# Patient Record
Sex: Male | Born: 1995 | Race: Black or African American | Hispanic: No | Marital: Single | Smoking: Current some day smoker
Health system: Southern US, Community
[De-identification: ages and names within clinical notes are randomized; demographics above are authoritative.]

## PROBLEM LIST (undated history)

## (undated) ENCOUNTER — Ambulatory Visit (HOSPITAL_COMMUNITY): Admission: EM | Payer: Medicaid Other | Source: Home / Self Care

## (undated) DIAGNOSIS — F909 Attention-deficit hyperactivity disorder, unspecified type: Secondary | ICD-10-CM

## (undated) DIAGNOSIS — K760 Fatty (change of) liver, not elsewhere classified: Secondary | ICD-10-CM

## (undated) DIAGNOSIS — S83289A Other tear of lateral meniscus, current injury, unspecified knee, initial encounter: Secondary | ICD-10-CM

## (undated) DIAGNOSIS — J45909 Unspecified asthma, uncomplicated: Secondary | ICD-10-CM

## (undated) DIAGNOSIS — R7303 Prediabetes: Secondary | ICD-10-CM

## (undated) DIAGNOSIS — F419 Anxiety disorder, unspecified: Secondary | ICD-10-CM

## (undated) DIAGNOSIS — J302 Other seasonal allergic rhinitis: Secondary | ICD-10-CM

## (undated) DIAGNOSIS — I10 Essential (primary) hypertension: Secondary | ICD-10-CM

## (undated) HISTORY — PX: HAND SURGERY: SHX662

## (undated) HISTORY — DX: Attention-deficit hyperactivity disorder, unspecified type: F90.9

## (undated) HISTORY — DX: Unspecified asthma, uncomplicated: J45.909

## (undated) HISTORY — PX: CHOLECYSTECTOMY, LAPAROSCOPIC: SHX56

---
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
## 2017-01-29 ENCOUNTER — Ambulatory Visit (HOSPITAL_COMMUNITY)
Admission: EM | Admit: 2017-01-29 | Discharge: 2017-01-29 | Disposition: A | Discharge status: Home / Self Care | Payer: Medicaid Other | Attending: Internal Medicine | Admitting: Internal Medicine

## 2017-01-29 ENCOUNTER — Encounter (HOSPITAL_COMMUNITY): Payer: Self-pay | Admitting: *Deleted

## 2017-01-29 DIAGNOSIS — J069 Acute upper respiratory infection, unspecified: Secondary | ICD-10-CM | POA: Diagnosis not present

## 2017-01-29 MED ORDER — ONDANSETRON 4 MG PO TBDP: 4.0000 mg | ORAL_TABLET | Freq: Three times a day (TID) | ORAL | 0 refills | Status: DC | PRN

## 2017-01-29 MED ORDER — IPRATROPIUM BROMIDE 0.06 % NA SOLN: 2.0000 | Freq: Four times a day (QID) | NASAL | 0 refills | Status: DC

## 2017-01-29 MED ORDER — BENZONATATE 100 MG PO CAPS: 100.0000 mg | ORAL_CAPSULE | Freq: Three times a day (TID) | ORAL | 0 refills | Status: DC

## 2017-01-29 NOTE — ED Provider Notes (Signed)
CSN: [id number]     Arrival date & time 01/29/17  1344 History   First MD Initiated Contact with Patient 01/29/17 1545     Chief Complaint  Patient presents with  . Emesis   (Consider location/radiation/quality/duration/timing/severity/associated sxs/prior Treatment) The history is provided by the patient.  URI  Presenting symptoms: congestion, cough, ear pain and rhinorrhea   Presenting symptoms: no facial pain, no fatigue, no fever and no sore throat   Severity:  Moderate Onset quality:  Gradual Duration:  3 days Timing:  Constant Progression:  Worsening Chronicity:  New Relieved by:  None tried Worsened by:  Nothing Ineffective treatments:  None tried Associated symptoms: headaches   Associated symptoms: no arthralgias, no myalgias, no neck pain, no sinus pain, no sneezing, no swollen glands and no wheezing     Past Medical History:  Diagnosis Date  . ADHD (attention deficit hyperactivity disorder)   . Asthma    No past surgical history on file. Family History  Problem Relation Age of Onset  . Obesity Mother   . Sleep apnea Mother   . COPD Other   . Hyperlipidemia Other   . Hypertension Other    Social History  Substance Use Topics  . Smoking status: Never Smoker  . Smokeless tobacco: Never Used  . Alcohol use No    Review of Systems  Constitutional: Negative for fatigue and fever.  HENT: Positive for congestion, ear pain and rhinorrhea. Negative for sinus pain, sneezing and sore throat.   Respiratory: Positive for cough. Negative for wheezing.   Gastrointestinal: Positive for nausea and vomiting. Negative for abdominal pain and diarrhea.  Musculoskeletal: Negative for arthralgias, myalgias and neck pain.  Skin: Negative.   Neurological: Positive for headaches. Negative for light-headedness.    Allergies  Patient has no known allergies.  Home Medications   Prior to Admission medications   Medication Sig Start Date End Date Taking? Authorizing Provider   amphetamine-dextroamphetamine (ADDERALL XR) 30 MG 24 hr capsule Take 1 capsule (30 mg total) by mouth daily. 04/07/16   Verneda SkillHacker, Caroline T, FNP  amphetamine-dextroamphetamine (ADDERALL XR) 30 MG 24 hr capsule Take 1 capsule (30 mg total) by mouth daily. 12/06/16   Verneda SkillHacker, Caroline T, FNP  amphetamine-dextroamphetamine (ADDERALL XR) 30 MG 24 hr capsule Take 1 capsule (30 mg total) by mouth daily. 12/06/16   Verneda SkillHacker, Caroline T, FNP  amphetamine-dextroamphetamine (ADDERALL) 10 MG tablet Take 1 tablet in the afternoons as needed for work days 12/06/16   Alfonso RamusHacker, Caroline T, FNP  benzonatate (TESSALON) 100 MG capsule Take 1 capsule (100 mg total) by mouth every 8 (eight) hours. 01/29/17   Dorena BodoKennard, Henrique Parekh, NP  diclofenac (VOLTAREN) 75 MG EC tablet Take 1 tablet (75 mg total) by mouth 2 (two) times daily. 06/07/16   Elvina SidleLauenstein, Kurt, MD  FLUoxetine (PROZAC) 20 MG capsule Take 1 capsule (20 mg total) by mouth daily. 12/06/16   Verneda SkillHacker, Caroline T, FNP  ipratropium (ATROVENT) 0.06 % nasal spray Place 2 sprays into both nostrils 4 (four) times daily. 01/29/17   Dorena BodoKennard, Stuti Sandin, NP  mupirocin ointment (BACTROBAN) 2 % Apply 1 application topically 3 (three) times daily. For mild skin infection 11/22/16   Voncille LoEttefagh, Kate, MD  ondansetron (ZOFRAN ODT) 4 MG disintegrating tablet Take 1 tablet (4 mg total) by mouth every 8 (eight) hours as needed for nausea or vomiting. 01/29/17   Dorena BodoKennard, Brenin Heidelberger, NP  Vitamin D, Ergocalciferol, (DRISDOL) 50000 units CAPS capsule Take 1 capsule (50,000 Units total) by mouth every 7 (seven)  days. 12/06/16   Verneda Skill, FNP   Meds Ordered and Administered this Visit  Medications - No data to display  Pulse 81   Temp 98.8 F (37.1 C) (Oral)   Resp 18   SpO2 99%  No data found.   Physical Exam  Constitutional: He is oriented to person, place, and time. He appears well-developed and well-nourished. No distress.  HENT:  Head: Normocephalic.  Right Ear: External ear normal.   Left Ear: External ear normal.  Mouth/Throat: Oropharynx is clear and moist.  Eyes: Conjunctivae are normal.  Neck: Normal range of motion.  Cardiovascular: Normal rate and regular rhythm.   Pulmonary/Chest: Effort normal and breath sounds normal.  Abdominal: Soft. Bowel sounds are normal.  Neurological: He is alert and oriented to person, place, and time.  Skin: Skin is warm and dry. Capillary refill takes less than 2 seconds. He is not diaphoretic.  Psychiatric: He has a normal mood and affect. His behavior is normal.  Nursing note and vitals reviewed.   Urgent Care Course     Procedures (including critical care time)  Labs Review Labs Reviewed - No data to display  Imaging Review No results found.      MDM   1. Viral upper respiratory tract infection    Viral upper respiratory infection. Recommend rest, fluids, given Tessalon for cough, ipratropium for rhinorrhea, Zofran for nausea or vomiting. Return to clinic as needed.    Dorena Bodo, NP 01/29/17 1731

## 2017-01-29 NOTE — ED Triage Notes (Signed)
Pt  Reports   Vomiting       Burning      Sensation      X   3   Days   No  Diarrhea   No  abd  pain

## 2017-01-29 NOTE — Discharge Instructions (Signed)
You most likely have a viral URI, I advise rest, plenty of fluids and management of symptoms with over the counter medicines. For symptoms you may take Tylenol as needed every 4-6 hours for body aches or fever, not to exceed 4,000 mg a day, Take mucinex or mucinex DM ever 12 hours with a full glass of water, you may use an inhaled steroid such as Flonase, 2 sprays each nostril once a day for congestion, or an antihistamine such as Claritin or Zyrtec once a day. For cough, I have prescribed a medication called Tessalon. Take 1 tablet every 8 hours as needed for your cough. For Nausea, I have prescribed Zofran, take 1 tablet under the tongue every 8 hours as needed. For runny nose prescribed ipratropium, due to sprays each nostril up to 4 times a day. Should your symptoms worsen or fail to resolve, follow up with your primary care provider or return to clinic.

## 2017-02-15 DIAGNOSIS — Z0271 Encounter for disability determination: Secondary | ICD-10-CM

## 2017-02-19 DIAGNOSIS — S83289A Other tear of lateral meniscus, current injury, unspecified knee, initial encounter: Secondary | ICD-10-CM

## 2017-02-19 HISTORY — DX: Other tear of lateral meniscus, current injury, unspecified knee, initial encounter: S83.289A

## 2017-03-06 ENCOUNTER — Encounter: Payer: Self-pay | Admitting: Pediatrics

## 2017-03-06 ENCOUNTER — Ambulatory Visit (INDEPENDENT_AMBULATORY_CARE_PROVIDER_SITE_OTHER): Payer: Medicaid Other | Admitting: Pediatrics

## 2017-03-06 VITALS — BP 128/81 | HR 67 | Ht 69.0 in | Wt 302.0 lb

## 2017-03-06 DIAGNOSIS — F902 Attention-deficit hyperactivity disorder, combined type: Secondary | ICD-10-CM

## 2017-03-06 DIAGNOSIS — F4322 Adjustment disorder with anxiety: Secondary | ICD-10-CM

## 2017-03-06 DIAGNOSIS — Z6841 Body Mass Index (BMI) 40.0 and over, adult: Secondary | ICD-10-CM | POA: Diagnosis not present

## 2017-03-06 DIAGNOSIS — F909 Attention-deficit hyperactivity disorder, unspecified type: Secondary | ICD-10-CM

## 2017-03-06 MED ORDER — AMPHETAMINE-DEXTROAMPHET ER 30 MG PO CP24: 30.0000 mg | ORAL_CAPSULE | Freq: Every day | ORAL | 0 refills | Status: DC

## 2017-03-06 MED ORDER — FLUOXETINE HCL 20 MG PO CAPS: 20.0000 mg | ORAL_CAPSULE | Freq: Every day | ORAL | 3 refills | Status: DC

## 2017-03-06 NOTE — Progress Notes (Signed)
THIS RECORD MAY CONTAIN CONFIDENTIAL INFORMATION THAT SHOULD NOT BE RELEASED WITHOUT REVIEW OF THE SERVICE PROVIDER.  Adolescent Medicine Consultation Follow-Up Visit [name]  is a 21 y.o. male referred by [name], MD here today for follow-up regarding ADHD and anxiety.    Last seen in [facility] on 12/06/16 for above.   Plan at last visit included continuing current dose of Adderall and fluoxetine.   Pertinent Labs? No since visit on 11/22/16. Growth Chart Viewed? yes   History was provided by the patient.  Interpreter? no  PCP Confirmed?  yes  My Chart Activated?   no    Chief Complaint  Patient presents with  . Follow-up  . Medication Management    HPI:   He was working at [employer], stopped due to knee injury.  Planning in to work at [employer]. Patient has been doing well. He has not had any panic attacks in the last 4 weeks.  Patient is without concerns today.  He is without symptoms of headache, SOB, chest pain or abdominal pain.  He feels medication is working well and does not wish to have any changes today.   No LMP for male patient. No Known Allergies Outpatient Medications Prior to Visit  Medication Sig Dispense Refill  . FLUoxetine (PROZAC) 20 MG capsule Take 1 capsule (20 mg total) by mouth daily. 30 capsule 3  . mupirocin ointment (BACTROBAN) 2 % Apply 1 application topically 3 (three) times daily. For mild skin infection 22 g 1  . Vitamin D, Ergocalciferol, (DRISDOL) 50000 units CAPS capsule Take 1 capsule (50,000 Units total) by mouth every 7 (seven) days. 12 capsule 0  . amphetamine-dextroamphetamine (ADDERALL XR) 30 MG 24 hr capsule Take 1 capsule (30 mg total) by mouth daily. 30 capsule 0  . amphetamine-dextroamphetamine (ADDERALL XR) 30 MG 24 hr capsule Take 1 capsule (30 mg total) by mouth daily. 30 capsule 0  . amphetamine-dextroamphetamine (ADDERALL XR) 30 MG 24 hr capsule Take 1 capsule (30 mg total) by mouth daily. 30 capsule  0  . amphetamine-dextroamphetamine (ADDERALL) 10 MG tablet Take 1 tablet in the afternoons as needed for work days 30 tablet 0  . benzonatate (TESSALON) 100 MG capsule Take 1 capsule (100 mg total) by mouth every 8 (eight) hours. 21 capsule 0  . diclofenac (VOLTAREN) 75 MG EC tablet Take 1 tablet (75 mg total) by mouth 2 (two) times daily. 30 tablet 0  . ipratropium (ATROVENT) 0.06 % nasal spray Place 2 sprays into both nostrils 4 (four) times daily. 15 mL 0  . ondansetron (ZOFRAN ODT) 4 MG disintegrating tablet Take 1 tablet (4 mg total) by mouth every 8 (eight) hours as needed for nausea or vomiting. 20 tablet 0   No facility-administered medications prior to visit.      Patient Active Problem List   Diagnosis Date Noted  . Chronic pain of right knee 11/22/2016  . Elevated BP without diagnosis of hypertension 11/22/2016  . Acanthosis nigricans 07/09/2014  . Allergic rhinitis 12/29/2013  . Adjustment disorder with anxiety 12/10/2013  . Vitamin D deficiency 07/12/2013  . ADHD (attention deficit hyperactivity disorder) 05/15/2013  . Obesity 05/15/2013    Social History:  Currently in long-term relationship with girlfriend of 3 years.    The following portions of the patient's history were reviewed and updated as appropriate: allergies, current medications, past family history, past medical history, past social history and problem list.  Physical Exam:  Vitals:   03/06/17 0915  BP: 128/81  Pulse: 67  Weight: (!) 302 lb (137 kg)  Height: 5\' 9"  (1.753 m)   BP 128/81   Pulse 67   Ht 5\' 9"  (1.753 m)   Wt (!) 302 lb (137 kg)   BMI 44.60 kg/m  Body mass index: body mass index is 44.6 kg/m. Blood pressure percentiles are 76 % systolic and 89 % diastolic based on the August 2017 AAP Clinical Practice Guideline. Blood pressure percentile targets: 90: 134/82, 95: 138/86, 95 + 12 mmHg: 150/98. This reading is in the Stage 1 hypertension range (BP >= 130/80).   Physical Exam General:  Well-appearing, well-nourished.  Pleasant adolescent male.  HEENT: Normocephalic, atraumatic, MMM. Oropharynx no erythema no exudates. Neck supple, no lymphadenopathy.  CV: Regular rate and rhythm, normal S1 and S2, no murmurs rubs or gallops.  PULM: Comfortable work of breathing. No accessory muscle use. Lungs CTA bilaterally without wheezes, rales, rhonchi.  ABD: Soft, non tender, non distended, normal bowel sounds.  EXT: Warm and well-perfused. Neuro: Grossly intact. No neurologic focalization.  Skin: Acanthosis nigricans around the neck    Assessment/Plan: 1. Attention deficit hyperactivity disorder (ADHD), combined type -Continue to take Adderall as prescribed  -GAD-7 score is zero without report of panic attacks.  -Patient is doing well.  - amphetamine-dextroamphetamine (ADDERALL XR) 30 MG 24 hr capsule; Take 1 capsule (30 mg total) by mouth daily.  Dispense: 30 capsule; Refill: 0 - amphetamine-dextroamphetamine (ADDERALL XR) 30 MG 24 hr capsule; Take 1 capsule (30 mg total) by mouth daily.  Dispense: 30 capsule; Refill: 0  2. Adjustment disorder with anxiety -Patient to continue taking fluoxetine.   - FLUoxetine (PROZAC) 20 MG capsule; Take 1 capsule (20 mg total) by mouth daily.  Dispense: 30 capsule; Refill: 3  3. Class 3 severe obesity due to excess calories without serious comorbidity with body mass index (BMI) of 40.0 to 44.9 in adult [facility](HCC) -Patient with continued weight gain s/p knee injury -He has started to exercise on the weekend with his friends.  -Encouraged to work on healthier lifestyle changes such as eating less fast food.  - Provided 5-2-1-0 rule counseling (Five fruits and vegetables a day, Two hours or less of non-educational screen time, 1 hour of physical activity per day, 0 sugary drinks)    BH screenings: PHQ-SADS reviewed and indicated no active depression or anxiety. Screens discussed with patient and parent and adjustments to plan made accordingly.    Follow-up:  Return for 3 month follow-up for medication management.   Medical decision-making:  >25 minutes spent face to face with patient with more than 50% of appointment spent discussing diagnosis, management, follow-up, and reviewing of symptoms related to visit.

## 2017-03-13 ENCOUNTER — Telehealth: Payer: Self-pay

## 2017-03-13 NOTE — Telephone Encounter (Signed)
Mom called stating patient did not receive his Adderall 10 mg tablets and only received 30 mg tabs. Mom requested refill before pt goes out of town tomorrow.

## 2017-03-14 ENCOUNTER — Other Ambulatory Visit: Payer: Self-pay | Admitting: Family

## 2017-03-14 MED ORDER — AMPHETAMINE-DEXTROAMPHET ER 10 MG PO CP24: 10.0000 mg | ORAL_CAPSULE | Freq: Every day | ORAL | 0 refills | Status: DC

## 2017-03-14 NOTE — Telephone Encounter (Signed)
I was out of the office yesterday.  I tried to call the patient today but there was no answer and the VM was not set up.

## 2017-03-14 NOTE — Telephone Encounter (Signed)
Rx written and ready for pick up

## 2017-03-16 ENCOUNTER — Encounter (HOSPITAL_BASED_OUTPATIENT_CLINIC_OR_DEPARTMENT_OTHER): Payer: Self-pay | Admitting: Physician Assistant

## 2017-03-16 NOTE — H&P (Signed)
[name] is an 21 y.o. male.   Chief Complaint: right knee lateral meniscus tear HPI: 21 year old male with a right knee injury last fall.  He has tried rest, NSAIDS, home exercise program.  MRI shows lateral meniscus tear  Past Medical History:  Diagnosis Date  . Acute lateral meniscus tear of right knee 05/22/2016  . ADHD (attention deficit hyperactivity disorder)   . Asthma     No past surgical history on file.  Family History  Problem Relation Age of Onset  . Obesity Mother   . Sleep apnea Mother   . COPD Other   . Hyperlipidemia Other   . Hypertension Other    Social History:  reports that he has never smoked. He has never used smokeless tobacco. He reports that he does not drink alcohol or use drugs.  Allergies: No Known Allergies  No prescriptions prior to admission.    No results found for this or any previous visit (from the past 48 hour(s)). No results found.  Review of Systems  Constitutional: Negative.   HENT: Negative.   Eyes: Negative.   Respiratory: Negative.   Cardiovascular: Negative.   Gastrointestinal: Negative.   Genitourinary: Negative.   Musculoskeletal: Positive for joint pain.       Right knee pain  Skin: Negative.   Neurological: Negative.   Endo/Heme/Allergies: Negative.   Psychiatric/Behavioral: Negative.     Height 5\' 8"  (1.727 m), weight 129.3 kg (285 lb). Physical Exam  Constitutional: He appears well-developed and well-nourished.  HENT:  Head: Normocephalic and atraumatic.  Eyes: Pupils are equal, round, and reactive to light. Conjunctivae are normal.  Neck: Neck supple.  Cardiovascular: Normal rate.   Respiratory: Effort normal.  GI: Soft.  Musculoskeletal:       Right knee: He exhibits bony tenderness and abnormal meniscus. He exhibits no LCL laxity and no MCL laxity. Tenderness found. Lateral joint line tenderness noted. No MCL, no LCL and no patellar tendon tenderness noted.  Neurological: He is alert.  Skin: Skin  is warm, dry and intact.  Psychiatric: He has a normal mood and affect. His speech is normal.     Assessment Active Problems:   Acute lateral meniscus tear of right knee    Plan Right knee arthroscopy with partial lateral meniscectomy.   The risks, benefits, and possible complications of the procedure were discussed in detail with the patient.  The patient is without question.  Pascal LuxSHEPPERSON,Yaser Harvill J, PA-C 03/16/2017, 10:01 AM

## 2017-03-22 ENCOUNTER — Ambulatory Visit (HOSPITAL_BASED_OUTPATIENT_CLINIC_OR_DEPARTMENT_OTHER): Payer: Medicaid Other | Admitting: Certified Registered"

## 2017-03-22 ENCOUNTER — Encounter (HOSPITAL_BASED_OUTPATIENT_CLINIC_OR_DEPARTMENT_OTHER)
Admission: RE | Disposition: A | Discharge status: Home / Self Care | Payer: Self-pay | Source: Ambulatory Visit | Attending: Orthopedic Surgery

## 2017-03-22 ENCOUNTER — Ambulatory Visit (HOSPITAL_BASED_OUTPATIENT_CLINIC_OR_DEPARTMENT_OTHER)
Admission: RE | Admit: 2017-03-22 | Discharge: 2017-03-22 | Disposition: A | Discharge status: Home / Self Care | Payer: Medicaid Other | Source: Ambulatory Visit | Attending: Orthopedic Surgery | Admitting: Orthopedic Surgery

## 2017-03-22 ENCOUNTER — Encounter (HOSPITAL_BASED_OUTPATIENT_CLINIC_OR_DEPARTMENT_OTHER): Payer: Self-pay | Admitting: *Deleted

## 2017-03-22 DIAGNOSIS — Z6841 Body Mass Index (BMI) 40.0 and over, adult: Secondary | ICD-10-CM | POA: Diagnosis not present

## 2017-03-22 DIAGNOSIS — Y9361 Activity, american tackle football: Secondary | ICD-10-CM | POA: Insufficient documentation

## 2017-03-22 DIAGNOSIS — X501XXA Overexertion from prolonged static or awkward postures, initial encounter: Secondary | ICD-10-CM | POA: Diagnosis not present

## 2017-03-22 DIAGNOSIS — S83282A Other tear of lateral meniscus, current injury, left knee, initial encounter: Secondary | ICD-10-CM | POA: Diagnosis not present

## 2017-03-22 DIAGNOSIS — J45909 Unspecified asthma, uncomplicated: Secondary | ICD-10-CM | POA: Diagnosis not present

## 2017-03-22 DIAGNOSIS — F909 Attention-deficit hyperactivity disorder, unspecified type: Secondary | ICD-10-CM | POA: Diagnosis not present

## 2017-03-22 DIAGNOSIS — Y92321 Football field as the place of occurrence of the external cause: Secondary | ICD-10-CM | POA: Diagnosis not present

## 2017-03-22 DIAGNOSIS — S83281A Other tear of lateral meniscus, current injury, right knee, initial encounter: Secondary | ICD-10-CM | POA: Diagnosis present

## 2017-03-22 HISTORY — PX: KNEE ARTHROSCOPY WITH LATERAL MENISECTOMY: SHX6193

## 2017-03-22 HISTORY — DX: Other tear of lateral meniscus, current injury, unspecified knee, initial encounter: S83.289A

## 2017-03-22 SURGERY — ARTHROSCOPY, KNEE, WITH LATERAL MENISCECTOMY
Anesthesia: General | Site: Knee | Laterality: Right

## 2017-03-22 MED ORDER — FENTANYL CITRATE (PF) 100 MCG/2ML IJ SOLN
INTRAMUSCULAR | Status: AC
  Filled 2017-03-22: qty 2

## 2017-03-22 MED ORDER — ONDANSETRON HCL 4 MG/2ML IJ SOLN
INTRAMUSCULAR | Status: DC | PRN
  Administered 2017-03-22: 4 mg via INTRAVENOUS

## 2017-03-22 MED ORDER — DEXAMETHASONE SODIUM PHOSPHATE 4 MG/ML IJ SOLN
INTRAMUSCULAR | Status: DC | PRN
  Administered 2017-03-22: 10 mg via INTRAVENOUS

## 2017-03-22 MED ORDER — LIDOCAINE 2% (20 MG/ML) 5 ML SYRINGE
INTRAMUSCULAR | Status: AC
  Filled 2017-03-22: qty 15

## 2017-03-22 MED ORDER — LACTATED RINGERS IV SOLN
INTRAVENOUS | Status: DC
  Administered 2017-03-22 (×2): via INTRAVENOUS

## 2017-03-22 MED ORDER — SCOPOLAMINE 1 MG/3DAYS TD PT72: 1.0000 | MEDICATED_PATCH | Freq: Once | TRANSDERMAL | Status: DC | PRN

## 2017-03-22 MED ORDER — HYDROMORPHONE HCL 1 MG/ML IJ SOLN: 0.2500 mg | INTRAMUSCULAR | Status: DC | PRN

## 2017-03-22 MED ORDER — MIDAZOLAM HCL 2 MG/2ML IJ SOLN
INTRAMUSCULAR | Status: AC
  Filled 2017-03-22: qty 2

## 2017-03-22 MED ORDER — EPINEPHRINE 30 MG/30ML IJ SOLN
INTRAMUSCULAR | Status: AC
  Filled 2017-03-22: qty 1

## 2017-03-22 MED ORDER — SODIUM CHLORIDE 0.9 % IR SOLN
Status: DC | PRN
  Administered 2017-03-22: 10:00:00

## 2017-03-22 MED ORDER — POVIDONE-IODINE 7.5 % EX SOLN: Freq: Once | CUTANEOUS | Status: DC

## 2017-03-22 MED ORDER — CEFAZOLIN SODIUM 10 G IJ SOLR
3.0000 g | INTRAMUSCULAR | Status: AC
  Administered 2017-03-22: 3 g via INTRAVENOUS

## 2017-03-22 MED ORDER — HYDROCODONE-ACETAMINOPHEN 5-325 MG PO TABS: ORAL_TABLET | ORAL | 0 refills | Status: DC

## 2017-03-22 MED ORDER — EPHEDRINE 5 MG/ML INJ
INTRAVENOUS | Status: AC
  Filled 2017-03-22: qty 10

## 2017-03-22 MED ORDER — MIDAZOLAM HCL 2 MG/2ML IJ SOLN
1.0000 mg | INTRAMUSCULAR | Status: DC | PRN
  Administered 2017-03-22 (×2): 2 mg via INTRAVENOUS

## 2017-03-22 MED ORDER — DEXAMETHASONE SODIUM PHOSPHATE 10 MG/ML IJ SOLN
INTRAMUSCULAR | Status: AC
  Filled 2017-03-22: qty 3

## 2017-03-22 MED ORDER — LIDOCAINE HCL (CARDIAC) 20 MG/ML IV SOLN
INTRAVENOUS | Status: DC | PRN
  Administered 2017-03-22: 60 mg via INTRAVENOUS

## 2017-03-22 MED ORDER — BUPIVACAINE-EPINEPHRINE (PF) 0.5% -1:200000 IJ SOLN
INTRAMUSCULAR | Status: DC | PRN
  Administered 2017-03-22: 30 mL

## 2017-03-22 MED ORDER — PROMETHAZINE HCL 25 MG/ML IJ SOLN: 6.2500 mg | INTRAMUSCULAR | Status: DC | PRN

## 2017-03-22 MED ORDER — PROPOFOL 500 MG/50ML IV EMUL
INTRAVENOUS | Status: AC
  Filled 2017-03-22: qty 100

## 2017-03-22 MED ORDER — FENTANYL CITRATE (PF) 100 MCG/2ML IJ SOLN
50.0000 ug | INTRAMUSCULAR | Status: AC | PRN
  Administered 2017-03-22 (×2): 100 ug via INTRAVENOUS
  Administered 2017-03-22: 50 ug via INTRAVENOUS

## 2017-03-22 MED ORDER — PROPOFOL 10 MG/ML IV BOLUS
INTRAVENOUS | Status: DC | PRN
  Administered 2017-03-22: 200 mg via INTRAVENOUS

## 2017-03-22 MED ORDER — CEFAZOLIN SODIUM-DEXTROSE 2-4 GM/100ML-% IV SOLN
INTRAVENOUS | Status: AC
  Filled 2017-03-22: qty 100

## 2017-03-22 MED ORDER — CEFAZOLIN SODIUM-DEXTROSE 1-4 GM/50ML-% IV SOLN
INTRAVENOUS | Status: AC
  Filled 2017-03-22: qty 50

## 2017-03-22 MED ORDER — ONDANSETRON HCL 4 MG/2ML IJ SOLN
INTRAMUSCULAR | Status: AC
  Filled 2017-03-22: qty 10

## 2017-03-22 MED ORDER — LACTATED RINGERS IV SOLN: INTRAVENOUS | Status: DC

## 2017-03-22 SURGICAL SUPPLY — 44 items
BANDAGE ACE 6X5 VEL STRL LF (GAUZE/BANDAGES/DRESSINGS) ×3 IMPLANT
BLADE CUDA GRT WHITE 3.5 (BLADE) IMPLANT
BLADE CUTTER GATOR 3.5 (BLADE) IMPLANT
BLADE GREAT WHITE 4.2 (BLADE) ×2 IMPLANT
BLADE GREAT WHITE 4.2MM (BLADE) ×1
BLADE SURG 15 STRL LF DISP TIS (BLADE) IMPLANT
BLADE SURG 15 STRL SS (BLADE)
BNDG COHESIVE 4X5 TAN STRL (GAUZE/BANDAGES/DRESSINGS) IMPLANT
DRAPE ARTHROSCOPY W/POUCH 90 (DRAPES) ×3 IMPLANT
DURAPREP 26ML APPLICATOR (WOUND CARE) ×3 IMPLANT
GAUZE SPONGE 4X4 12PLY STRL (GAUZE/BANDAGES/DRESSINGS) ×3 IMPLANT
GAUZE XEROFORM 1X8 LF (GAUZE/BANDAGES/DRESSINGS) ×3 IMPLANT
GLOVE BIO SURGEON STRL SZ7 (GLOVE) ×3 IMPLANT
GLOVE BIOGEL PI IND STRL 7.0 (GLOVE) ×3 IMPLANT
GLOVE BIOGEL PI IND STRL 7.5 (GLOVE) ×1 IMPLANT
GLOVE BIOGEL PI INDICATOR 7.0 (GLOVE) ×6
GLOVE BIOGEL PI INDICATOR 7.5 (GLOVE) ×2
GLOVE ECLIPSE 6.5 STRL STRAW (GLOVE) ×9 IMPLANT
GLOVE SS BIOGEL STRL SZ 7.5 (GLOVE) ×1 IMPLANT
GLOVE SUPERSENSE BIOGEL SZ 7.5 (GLOVE) ×2
GOWN STRL REUS W/ TWL LRG LVL3 (GOWN DISPOSABLE) ×3 IMPLANT
GOWN STRL REUS W/TWL LRG LVL3 (GOWN DISPOSABLE) ×9
HOLDER KNEE FOAM BLUE (MISCELLANEOUS) ×3 IMPLANT
KNEE WRAP E Z 3 GEL PACK (MISCELLANEOUS) ×3 IMPLANT
MANIFOLD NEPTUNE II (INSTRUMENTS) ×3 IMPLANT
NDL SAFETY ECLIPSE 18X1.5 (NEEDLE) ×1 IMPLANT
NEEDLE HYPO 18GX1.5 SHARP (NEEDLE) ×3
NEEDLE HYPO 22GX1.5 SAFETY (NEEDLE) IMPLANT
PACK ARTHROSCOPY DSU (CUSTOM PROCEDURE TRAY) ×3 IMPLANT
PACK BASIN DAY SURGERY FS (CUSTOM PROCEDURE TRAY) ×3 IMPLANT
PAD ALCOHOL SWAB (MISCELLANEOUS) IMPLANT
SET ARTHROSCOPY TUBING (MISCELLANEOUS) ×2
SET ARTHROSCOPY TUBING LN (MISCELLANEOUS) ×1 IMPLANT
SUCTION FRAZIER HANDLE 10FR (MISCELLANEOUS)
SUCTION TUBE FRAZIER 10FR DISP (MISCELLANEOUS) IMPLANT
SUT ETHILON 4 0 PS 2 18 (SUTURE) ×3 IMPLANT
SUT PROLENE 3 0 PS 2 (SUTURE) IMPLANT
SUT VIC AB 3-0 PS1 18 (SUTURE)
SUT VIC AB 3-0 PS1 18XBRD (SUTURE) IMPLANT
SYR 20CC LL (SYRINGE) IMPLANT
SYR 5ML LL (SYRINGE) ×3 IMPLANT
TOWEL OR 17X24 6PK STRL BLUE (TOWEL DISPOSABLE) ×3 IMPLANT
WAND STAR VAC 90 (SURGICAL WAND) IMPLANT
WATER STERILE IRR 1000ML POUR (IV SOLUTION) ×3 IMPLANT

## 2017-03-22 NOTE — Anesthesia Postprocedure Evaluation (Signed)
Anesthesia Post Note  Patient: [name]  Procedure(s) Performed: Procedure(s) (LRB): RIGHT KNEE ARTHROSCOPY WITH LATERAL MENISCECTOMY (Right)     Patient location during evaluation: PACU Anesthesia Type: General Level of consciousness: awake and alert Pain management: pain level controlled Vital Signs Assessment: post-procedure vital signs reviewed and stable Respiratory status: spontaneous breathing, nonlabored ventilation and respiratory function stable Cardiovascular status: blood pressure returned to baseline and stable Postop Assessment: no signs of nausea or vomiting Anesthetic complications: no    Last Vitals:  Vitals:   03/22/17 1130 03/22/17 1153  BP: 121/81 130/90  Pulse: 69 70  Resp: 14 18  Temp:  36.5 C    Last Pain:  Vitals:   03/22/17 1153  TempSrc:   PainSc: 0-No pain                 Charla Criscione,W. EDMOND

## 2017-03-22 NOTE — Discharge Instructions (Signed)

## 2017-03-22 NOTE — Transfer of Care (Signed)
Immediate Anesthesia Transfer of Care Note  Patient: [name]  Procedure(s) Performed: Procedure(s): RIGHT KNEE ARTHROSCOPY WITH LATERAL MENISCECTOMY (Right)  Patient Location: PACU  Anesthesia Type:General  Level of Consciousness: awake and patient cooperative  Airway & Oxygen Therapy: Patient Spontanous Breathing and Patient connected to face mask oxygen  Post-op Assessment: Report given to RN and Post -op Vital signs reviewed and stable  Post vital signs: Reviewed and stable  Last Vitals:  Vitals:   03/22/17 0945 03/22/17 1043  BP: 127/82   Pulse: 79 77  Resp: (!) 24   Temp:      Last Pain:  Vitals:   03/22/17 0909  TempSrc: Oral  PainSc: 0-No pain         Complications: No apparent anesthesia complications

## 2017-03-22 NOTE — Interval H&P Note (Signed)
History and Physical Interval Note:  03/22/2017 9:57 AM  [name]  has presented today for surgery, with the diagnosis of right knee lateral meniscal tear  The various methods of treatment have been discussed with the patient and family. After consideration of risks, benefits and other options for treatment, the patient has consented to  Procedure(s): RIGHT KNEE ARTHROSCOPY WITH LATERAL MENISCECTOMY (Right) as a surgical intervention .  The patient's history has been reviewed, patient examined, no change in status, stable for surgery.  I have reviewed the patient's chart and labs.  Questions were answered to the patient's satisfaction.     Salvatore MarvelWAINER,Amiere Cawley A

## 2017-03-22 NOTE — Anesthesia Preprocedure Evaluation (Addendum)
Anesthesia Evaluation  Patient identified by MRN, date of birth, ID band Patient awake    Reviewed: Allergy & Precautions, H&P , NPO status , Patient's Chart, lab work & pertinent test results  Airway Mallampati: II  TM Distance: >3 FB Neck ROM: full    Dental no notable dental hx. (+) Dental Advidsory Given   Pulmonary asthma ,    breath sounds clear to auscultation       Cardiovascular negative cardio ROS   Rhythm:regular Rate:Normal     Neuro/Psych negative neurological ROS  negative psych ROS   GI/Hepatic negative GI ROS, Neg liver ROS,   Endo/Other  Morbid obesity  Renal/GU negative Renal ROS     Musculoskeletal   Abdominal   Peds  Hematology   Anesthesia Other Findings   Reproductive/Obstetrics negative OB ROS                            Anesthesia Physical Anesthesia Plan  ASA: III  Anesthesia Plan: General LMA   Post-op Pain Management:    Induction:   PONV Risk Score and Plan: 2 and Ondansetron and Dexamethasone  Airway Management Planned:   Additional Equipment:   Intra-op Plan:   Post-operative Plan:   Informed Consent: I have reviewed the patients History and Physical, chart, labs and discussed the procedure including the risks, benefits and alternatives for the proposed anesthesia with the patient or authorized representative who has indicated his/her understanding and acceptance.   Dental Advisory Given  Plan Discussed with: Anesthesiologist, CRNA and Surgeon  Anesthesia Plan Comments:        Anesthesia Quick Evaluation

## 2017-03-22 NOTE — Progress Notes (Signed)
Assisted Dr. Krista BlueSinger with right, knee block. Side rails up, monitors on throughout procedure. See vital signs in flow sheet. Tolerated Procedure well.

## 2017-03-22 NOTE — Anesthesia Procedure Notes (Signed)
Procedure Name: LMA Insertion Date/Time: 03/22/2017 10:08 AM Performed by: Wadell Craddock D Pre-anesthesia Checklist: Patient identified, Emergency Drugs available, Suction available and Patient being monitored Patient Re-evaluated:Patient Re-evaluated prior to induction Oxygen Delivery Method: Circle system utilized Preoxygenation: Pre-oxygenation with 100% oxygen Induction Type: IV induction Ventilation: Mask ventilation without difficulty LMA: LMA inserted LMA Size: 4.0 Number of attempts: 1 Airway Equipment and Method: Bite block Placement Confirmation: positive ETCO2 Tube secured with: Tape Dental Injury: Teeth and Oropharynx as per pre-operative assessment

## 2017-03-23 ENCOUNTER — Encounter (HOSPITAL_BASED_OUTPATIENT_CLINIC_OR_DEPARTMENT_OTHER): Payer: Self-pay | Admitting: Orthopedic Surgery

## 2017-03-23 NOTE — Op Note (Signed)
NAME:  [name], David                   ACCOUNT NO.:  MEDICAL RECORD NO.:  [id number]  LOCATION:                                 FACILITY:  PHYSICIAN:  Jazmyne Beauchesne A. Thurston HoleWainer, M.D.      DATE OF BIRTH:  DATE OF PROCEDURE:  03/22/2017 DATE OF DISCHARGE:                              OPERATIVE REPORT   PREOPERATIVE DIAGNOSIS:  Right knee acute traumatic lateral meniscus tear.  POSTOPERATIVE DIAGNOSIS:  Right knee acute traumatic lateral meniscus tear.  PROCEDURE:  Right knee EUA followed by arthroscopic partial lateral meniscectomy.  SURGEON:  Elana Almobert A. Thurston HoleWainer, M.D.  ASSISTANT:  Kirstin Shepperson, PA-C.  ANESTHESIA:  General.  OPERATIVE TIME:  30 minutes.  COMPLICATIONS:  None.  INDICATION FOR PROCEDURE:  [name] is an 21 year old, who sustained a twisting injury to his right knee playing football 9 months ago.  Exam and MRIs revealed a lateral meniscus tear.  He has failed conservative care and is now to undergo arthroscopy.  DESCRIPTION OF PROCEDURE:  [name] was brought to the operating room on March 22, 2017, after knee block, was placed in the holding room by Anesthesia.  He was placed on the operating table in supine position. He received antibiotics preoperatively for prophylaxis.  After being placed under general anesthesia, his right knee was examined.  He had full range of motion.  Knee was stable.  Ligamentous exam with normal patellar tracking.  The right leg was prepped using sterile DuraPrep and draped using sterile technique.  Time-out procedure was called and the correct right knee identified.  Initially, through an anterior lateral portal, the arthroscope with a pump attached was placed into an anteromedial portal and arthroscopic probe was placed.  On initial inspection of the medial compartment, the articular cartilage was normal.  Medial meniscus was normal.  Intercondylar notch inspected. Anterior and posterior cruciate ligaments were normal.   Lateral compartment was inspected.  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  Lateral meniscus showed a radial tear, non- repairable in the posterior lateral corner of which 30% to 40% of the posterior lateral horn were resected back to a stable rim. Patellofemoral joint articular cartilage was normal.  The patella tracked normally.  Medial and lateral gutters were free of pathology. At this point, I felt that all pathology has been satisfactorily addressed.  The instruments were removed.  Portals closed with 3-0 nylon suture.  Sterile dressings were applied and the patient was awakened and taken to recovery room in stable condition.  FOLLOWUP CARE:  [name] will be followed as an outpatient on Norco for pain.  He will be seen back in office in a week for sutures out and followup.     Maddilynn Esperanza A. Thurston HoleWainer, M.D.     RAW/MEDQ  D:  03/22/2017  T:  03/22/2017  Job:  [id number]

## 2017-03-23 NOTE — Addendum Note (Signed)
Addendum  created 03/23/17 0858 by Lance CoonWebster, [place], CRNA   Charge Capture section accepted

## 2017-04-23 IMAGING — CR DG CERVICAL SPINE COMPLETE 4+V
5 series · 5 of 5 positions shown · non-contrast
Comparison: None.

CLINICAL DATA: Neck pain, wrestling injury

EXAM:
CERVICAL SPINE - COMPLETE 4+ VIEW

[c-spine lat]
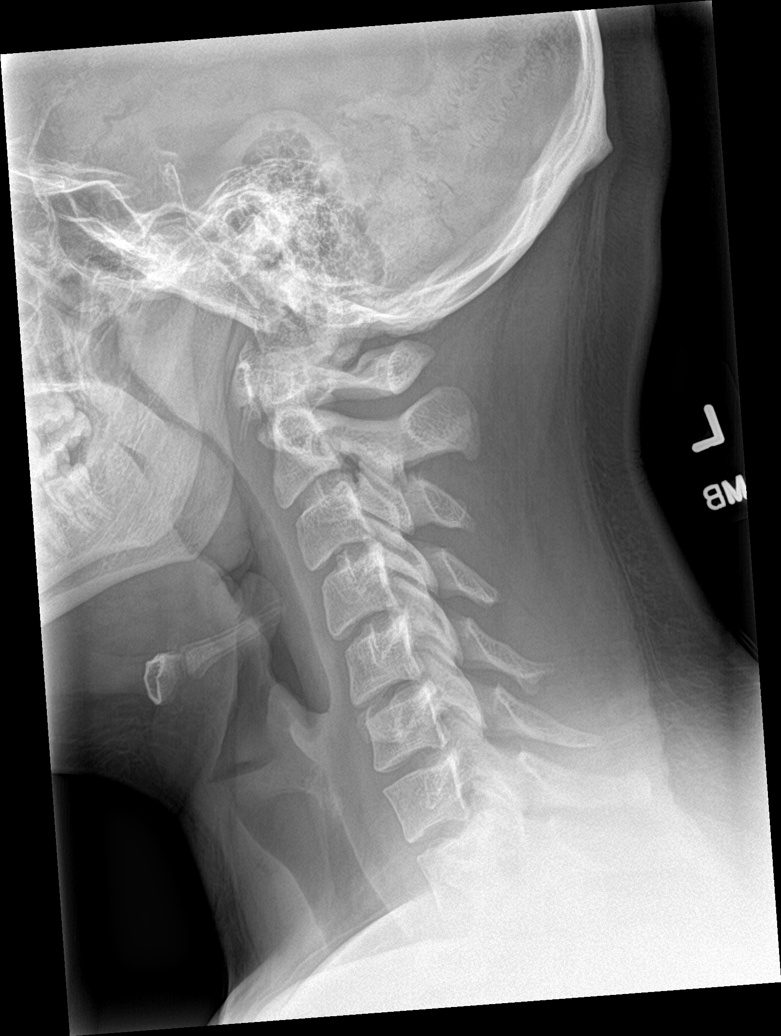

[c-spine obl (1 of 2)]
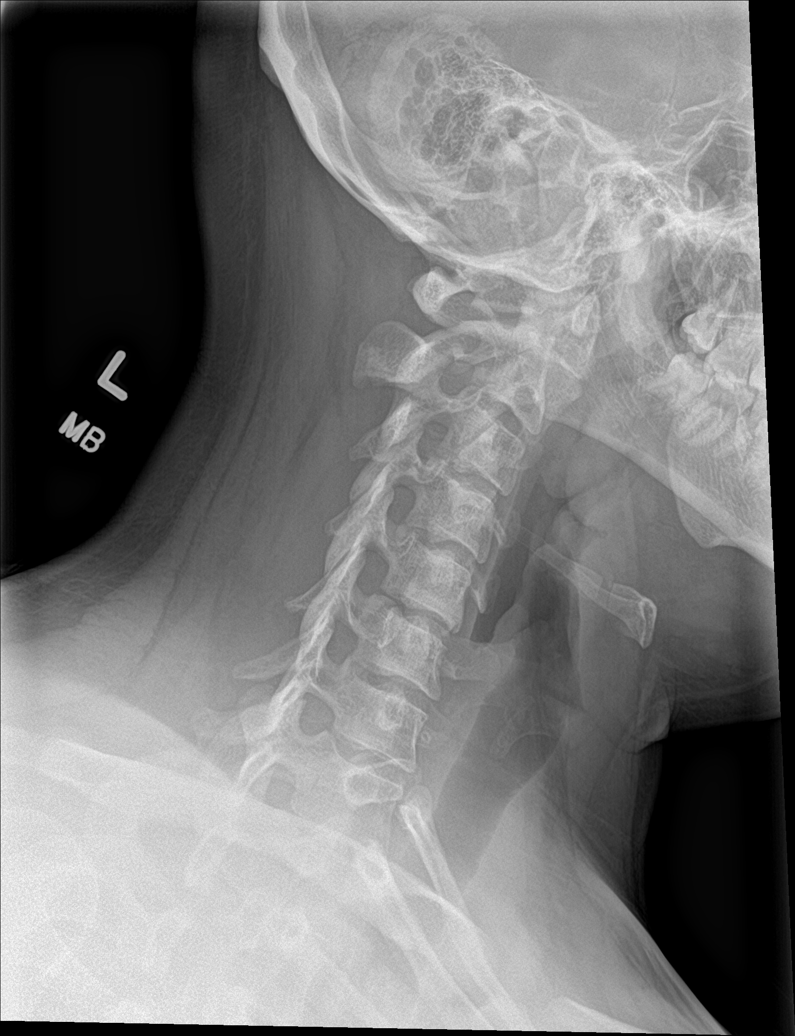

[c-spine obl (2 of 2)]
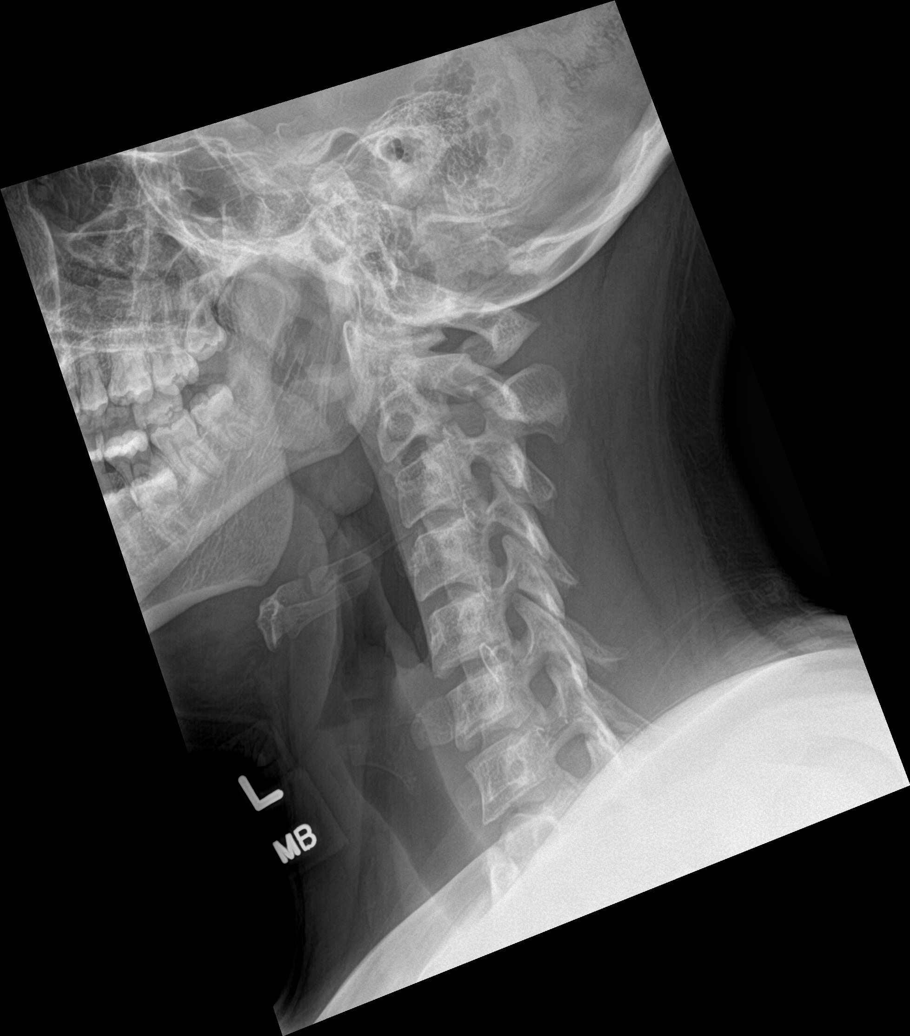

[c-spine ap]
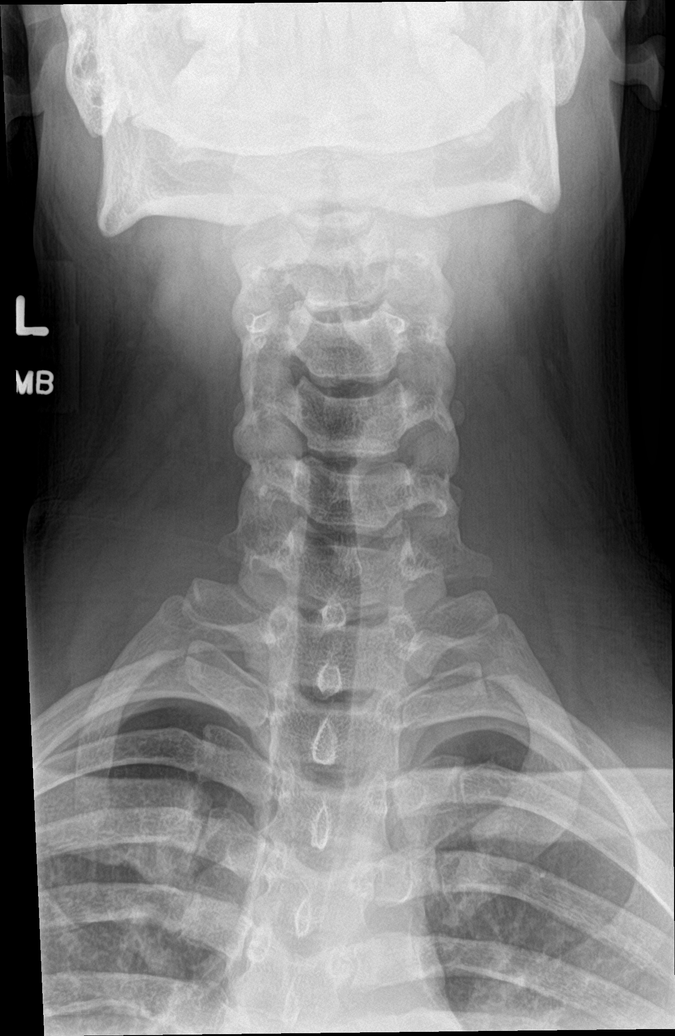

[c-spine open mouth]
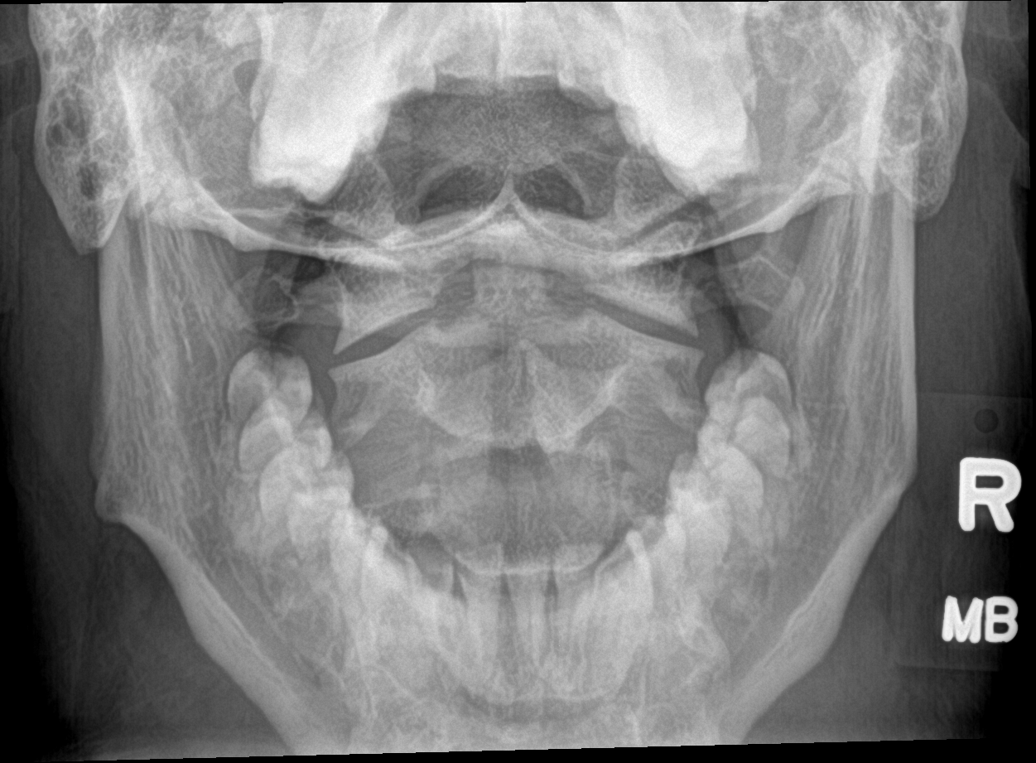

[5 of 5 positions shown; findings below may reference images not displayed]

FINDINGS: Cervical spine is visualized to C7-T1 on the lateral view.

Mild straightening of the cervical spine.

No evidence of fracture or dislocation. Vertebral body heights and
intervertebral disc spaces are maintained. Dens appears intact.
Lateral masses C1 are symmetric.

No prevertebral soft tissue swelling.

Bilateral neural foramina are patent.

Visualized lung apices are clear.
IMPRESSION: Negative cervical spine radiographs.

## 2017-05-16 ENCOUNTER — Other Ambulatory Visit: Payer: Self-pay | Admitting: Pediatrics

## 2017-05-16 ENCOUNTER — Telehealth: Payer: Self-pay | Admitting: Pediatrics

## 2017-05-16 DIAGNOSIS — F902 Attention-deficit hyperactivity disorder, combined type: Secondary | ICD-10-CM

## 2017-05-16 MED ORDER — AMPHETAMINE-DEXTROAMPHET ER 30 MG PO CP24: 30.0000 mg | ORAL_CAPSULE | Freq: Every day | ORAL | 0 refills | Status: DC

## 2017-05-16 NOTE — Telephone Encounter (Signed)
Mom called stating that the pt is completely out of medicine. She also stated that they missed an appt & will be here May 29 2017. Please call mom back to let her know if she can get a refill or not. Mom's number is [phone].

## 2017-05-16 NOTE — Telephone Encounter (Signed)
Called parent and made her aware of refill. Gave verbal consent for [facility] to pick up script from office. Pt agrees to sign form at next visit giving consent to pick up medication.

## 2017-05-16 NOTE — Telephone Encounter (Signed)
Refill printed, signed and left at the front desk.

## 2017-05-29 ENCOUNTER — Encounter: Payer: Self-pay | Admitting: Pediatrics

## 2017-05-29 ENCOUNTER — Ambulatory Visit (INDEPENDENT_AMBULATORY_CARE_PROVIDER_SITE_OTHER): Payer: Medicaid Other | Admitting: Pediatrics

## 2017-05-29 ENCOUNTER — Ambulatory Visit (HOSPITAL_COMMUNITY)
Admission: EM | Admit: 2017-05-29 | Discharge: 2017-05-29 | Disposition: A | Discharge status: Home / Self Care | Payer: Self-pay | Attending: Family Medicine | Admitting: Family Medicine

## 2017-05-29 VITALS — BP 132/85 | HR 70 | Ht 69.29 in | Wt 296.0 lb

## 2017-05-29 DIAGNOSIS — M545 Low back pain: Secondary | ICD-10-CM

## 2017-05-29 DIAGNOSIS — F902 Attention-deficit hyperactivity disorder, combined type: Secondary | ICD-10-CM | POA: Diagnosis not present

## 2017-05-29 DIAGNOSIS — E559 Vitamin D deficiency, unspecified: Secondary | ICD-10-CM | POA: Diagnosis not present

## 2017-05-29 DIAGNOSIS — S39012A Strain of muscle, fascia and tendon of lower back, initial encounter: Secondary | ICD-10-CM

## 2017-05-29 MED ORDER — AMPHETAMINE-DEXTROAMPHET ER 30 MG PO CP24: 30.0000 mg | ORAL_CAPSULE | Freq: Every day | ORAL | 0 refills | Status: DC

## 2017-05-29 MED ORDER — VITAMIN D (ERGOCALCIFEROL) 1.25 MG (50000 UNIT) PO CAPS: 50000.0000 [IU] | ORAL_CAPSULE | ORAL | 0 refills | Status: DC
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
## 2017-05-29 NOTE — ED Notes (Signed)
Patient seen by provider prior to triage

## 2017-05-29 NOTE — Progress Notes (Signed)
THIS RECORD MAY CONTAIN CONFIDENTIAL INFORMATION THAT SHOULD NOT BE RELEASED WITHOUT REVIEW OF THE SERVICE PROVIDER.  Adolescent Medicine Consultation Follow-Up Visit [name]  is a 21 y.o. male referred by [name], MD here today for follow-up regarding ADHD.    Last seen in [facility] Medicine Clinic on 03/06/2017 for ADHD and anxiety follow up.  Plan at last visit included continuing current dose of Adderall .  Pertinent Labs? No (no labs since 11/22/16) Growth Chart Viewed? yes   History was provided by the patient.  Interpreter? no  PCP Confirmed?  yes  My Chart Activated?   no   Chief Complaint  Patient presents with  . Follow-up  . Medication Management    HPI:   [name] is happy with his current management of ADHD. He feels that he can control himself more (to pay attention). He can go a day without taking his ADHD medication, but no more than 2 days. He normally takes his medication only on weekdays, not on weekends. He had a work injury last year at [employer]. 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 He is able to focus at work. No complaints from peers or bosses about his attention. He feels that his medication is working. He denies headaches, nausea, constipation. Medicine does suppress his appetite but he still eats 3 meals a day. His anxiety is unchanged, not associated with anything in particular.   ASRS: shows mainly inattentive type of ADHD with hyperactive component.   No LMP for male patient. No Known Allergies Outpatient Medications Prior to Visit  Medication Sig Dispense Refill  . albuterol (PROVENTIL HFA;VENTOLIN HFA) 108 (90 Base) MCG/ACT inhaler Inhale into the lungs every 6 (six) hours as needed for wheezing or shortness of breath.    Marland Kitchen HYDROcodone-acetaminophen (NORCO/VICODIN) 5-325 MG tablet 1-2 tablets every 4-6 hrs as needed for pain 20 tablet 0  . amphetamine-dextroamphetamine (ADDERALL XR) 30 MG 24 hr capsule Take 1 capsule (30 mg  total) by mouth daily. 30 capsule 0   No facility-administered medications prior to visit.      Patient Active Problem List   Diagnosis Date Noted  . Chronic pain of right knee 11/22/2016  . Elevated BP without diagnosis of hypertension 11/22/2016  . Acute lateral meniscus tear of right knee 05/22/2016  . Acanthosis nigricans 07/09/2014  . Allergic rhinitis 12/29/2013  . Adjustment disorder with anxiety 12/10/2013  . Vitamin D deficiency 07/12/2013  . ADHD (attention deficit hyperactivity disorder) 05/15/2013  . Obesity 05/15/2013    Social History: Changes with school since last visit?  No, not in school  Activities:  Special interests/hobbies/sports: Interested in a city job  Confidentiality was discussed with the patient and if applicable, with caregiver as well.  Changes at home or school since last visit:  no  Gender identity: Male Sex assigned at birth: Male Pronouns: he  Tobacco?  Occasional vaping with and without tobacco Drugs/ETOH?  no Partner preference?  male  Sexually Active?  yes  Pregnancy Prevention:  condoms Reviewed condoms:  yes Reviewed EC:  no   Suicidal or homicidal thoughts?   no Self injurious behaviors?  no   The following portions of the patient's history were reviewed and updated as appropriate: allergies, current medications, past family history, past medical history, past social history, past surgical history and problem list.  Physical Exam:  Vitals:   05/29/17 0919  BP: 132/85  Pulse: 70  Weight: 296 lb (134.3 kg)  Height: 5' 9.29" (1.76  m)   BP 132/85 (BP Location: Right Arm, Patient Position: Sitting, Cuff Size: Large)   Pulse 70   Ht 5' 9.29" (1.76 m)   Wt 296 lb (134.3 kg)   BMI 43.35 kg/m  Body mass index: body mass index is 43.35 kg/m. Blood pressure percentiles are 85 % systolic and 94 % diastolic based on the August 2017 AAP Clinical Practice Guideline. Blood pressure percentile targets: 90: 134/82, 95: 138/86, 95 +  12 mmHg: 150/98. This reading is in the Stage 1 hypertension range (BP >= 130/80).  Physical Exam  Constitutional: He is oriented to person, place, and time. He appears well-developed and well-nourished. No distress.  HENT:  Head: Normocephalic and atraumatic.  Cardiovascular: Normal heart sounds.   Pulmonary/Chest: Effort normal and breath sounds normal.  Musculoskeletal: Normal range of motion.  Neurological: He is alert and oriented to person, place, and time.  Skin: Skin is warm.  Psychiatric: He has a normal mood and affect. His behavior is normal. Judgment and thought content normal.    Assessment/Plan:  1. Attention deficit hyperactivity disorder (ADHD), combined type Currently well controlled without side-effects - Continue current dose of Adderall XR - amphetamine-dextroamphetamine (ADDERALL XR) 30 MG 24 hr capsule; Take 1 capsule (30 mg total) by mouth daily.  Dispense: 30 capsule; Refill: 0  2. Vitamin D deficiency Patient was found to have low Vit D.  - Take high dose Vitamin D once a week - Vitamin D, Ergocalciferol, (DRISDOL) 50000 units CAPS capsule; Take 1 capsule (50,000 Units total) by mouth every 7 (seven) days.  Dispense: 12 capsule; Refill: 0   BH screenings: PHQ-SADS reviewed and indicated no active depression or anxiety. Screens discussed with patient, no adjustments needed.   Follow-up:  3 months for ADHD follow up and medication management   Medical decision-making:  >25 minutes spent face to face with patient with more than 50% of appointment spent discussing diagnosis, management, follow-up, and reviewing of symptoms related to visit.

## 2017-05-29 NOTE — Discharge Instructions (Signed)
Apply heat to the left lower back off and on. Perform gentle stretches of the lower back muscles. Ibuprofen 600 mg every 6 hours as needed for pain. He may be sore for the next 4-5 days. No heavy lifting bending or twisting.

## 2017-05-29 NOTE — Patient Instructions (Signed)
Continue adderall xr 30 mg   You will need to take a high dose of Vitamin D (50,000 International Units) once weekly for the next 2 months.  I have sent a prescription for this high dose Vitamin D to the preferred pharmacy listed below.  You should take the high dose prescription Vitamin D once weekly for 2 months.  After completing the high dose Vitamin D, you will need to take 2000 International Units of Vitamin D every day.  Ask the pharmacist to recommend a Vitamin D supplement that contains 2000 International Units to start after finishing the prescribed high dose Vitamin D.  We will recheck the vitamin D level at a future visit.

## 2017-06-26 ENCOUNTER — Telehealth: Payer: Self-pay | Admitting: Pediatrics

## 2017-06-26 NOTE — Telephone Encounter (Signed)
Appt made per NP request.

## 2017-06-26 NOTE — Telephone Encounter (Signed)
Called mother to clarify. She reports that on medication "he stares off into space and unable to get motivated" and she requested letter to justify his side effects while at work. When patient was not on medication he was unable to concentrate to focus on job. When asked if he is currently employed and she stated he was terminated. Consulted Daiva Nakayamaaroline Hacker,NP who reports that patient needs an appointment to be assessed and to evaluate current medication regimen.

## 2017-06-26 NOTE — Telephone Encounter (Signed)
Mom called stating that the patient needs a note that states that his ADHD interferes with his job. Please call mom at [phone] with any questions or concerns.

## 2017-06-28 ENCOUNTER — Encounter: Payer: Self-pay | Admitting: Pediatrics

## 2017-06-28 ENCOUNTER — Ambulatory Visit: Payer: Medicaid Other | Admitting: Pediatrics

## 2017-06-28 ENCOUNTER — Ambulatory Visit (INDEPENDENT_AMBULATORY_CARE_PROVIDER_SITE_OTHER): Payer: Medicaid Other | Admitting: Pediatrics

## 2017-06-28 VITALS — BP 129/80 | HR 81 | Ht 69.29 in | Wt 297.2 lb

## 2017-06-28 DIAGNOSIS — F909 Attention-deficit hyperactivity disorder, unspecified type: Secondary | ICD-10-CM | POA: Diagnosis not present

## 2017-06-28 DIAGNOSIS — R030 Elevated blood-pressure reading, without diagnosis of hypertension: Secondary | ICD-10-CM

## 2017-06-28 MED ORDER — LISDEXAMFETAMINE DIMESYLATE 50 MG PO CAPS: 50.0000 mg | ORAL_CAPSULE | Freq: Every day | ORAL | 0 refills | Status: DC

## 2017-06-28 NOTE — Progress Notes (Addendum)
THIS RECORD MAY CONTAIN CONFIDENTIAL INFORMATION THAT SHOULD NOT BE RELEASED WITHOUT REVIEW OF THE SERVICE PROVIDER.  Adolescent Medicine Consultation Follow-Up Visit [name]  is a 21 y.o. male referred by Voncille LoEttefagh, Kate, MD here today for follow-up regarding ADHD and concern for medication side effect.    Last seen in [facility] Medicine Clinic on 05/29/2017 for ADHD follow up.  Plan at last visit included continued current medications (Adderall XR 30 mg daily, adderall 10 mg qPM), and reported no side effects at that time.  Pertinent Labs? No Growth Chart Viewed? yes   History was provided by the patient.  Interpreter? no  PCP Confirmed?  yes  My Chart Activated?   no    Chief Complaint  Patient presents with  . Follow-up    HPI:    Reports that since starting Adderall several years ago he has been having issues with daydreaming. This is worst in the morning during the first several hours after taking a dose, then again after he takes an afternoon dose. Does not occur on days when he does not take this medication, but mostly because he is "all over the place," though he is very forgetful. Overall, feels his ADHD is well controlled by Adderall other than daydreaming.  Was less of an issue when he was in school because the work would keep his attention better. 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 States he did not seek another job because he knew he would be having surgery on his knee, which occurred 3 months ago. Knee doing better.  He would now like to go back to work, but is unsure what he wants to do. Typical routine currently is taking his brother to the school bus then running errands for his mother. Keeps him busy, denies much down time.   Denies stressors currently. Girlfriend gave first to first child (son) 3 days ago. States he would be moe stressed out if it was a girl, but he feels he can handle a boy. Remains  involved with girlfriend and infant. Sleeping well, about 8 hours nightly. Good appetite. No headaches. Denies depression or anxious thoughts or feelings. No other concerns for other medication side effects.  Also requesting note for Social Security to receive disability due to ADHD.  No LMP for male patient. No Known Allergies Outpatient Medications Prior to Visit  Medication Sig Dispense Refill  . albuterol (PROVENTIL HFA;VENTOLIN HFA) 108 (90 Base) MCG/ACT inhaler Inhale into the lungs every 6 (six) hours as needed for wheezing or shortness of breath.    Marland Kitchen. HYDROcodone-acetaminophen (NORCO/VICODIN) 5-325 MG tablet 1-2 tablets every 4-6 hrs as needed for pain 20 tablet 0  . Vitamin D, Ergocalciferol, (DRISDOL) 50000 units CAPS capsule Take 1 capsule (50,000 Units total) by mouth every 7 (seven) days. 12 capsule 0  . amphetamine-dextroamphetamine (ADDERALL XR) 30 MG 24 hr capsule Take 1 capsule (30 mg total) by mouth daily. 30 capsule 0  . amphetamine-dextroamphetamine (ADDERALL XR) 30 MG 24 hr capsule Take 1 capsule (30 mg total) by mouth daily. 30 capsule 0  . amphetamine-dextroamphetamine (ADDERALL XR) 30 MG 24 hr capsule Take 1 capsule (30 mg total) by mouth daily. 30 capsule 0   No facility-administered medications prior to visit.      Patient Active Problem List   Diagnosis Date Noted  . Chronic pain of right knee 11/22/2016  . Elevated BP without diagnosis of hypertension 11/22/2016  . Acute lateral  meniscus tear of right knee 05/22/2016  . Acanthosis nigricans 07/09/2014  . Allergic rhinitis 12/29/2013  . Adjustment disorder with anxiety 12/10/2013  . Vitamin D deficiency 07/12/2013  . ADHD (attention deficit hyperactivity disorder) 05/15/2013  . Obesity 05/15/2013    Social History: Lives at home with mother, younger brother. Not currently working or in school.  Activities:  Special interests/hobbies/sports: None  Lifestyle habits that can impact QOL: Sleep: 8 hours  nightly Eating habits/patterns: no issues Screen time: limited, does not like to sit and watch TV Exercise: none lately  Confidentiality was discussed with the patient and if applicable, with caregiver as well.  Tobacco?  no Drugs/ETOH?  no Partner preference?  male  Sexually Active?  yes  Pregnancy Prevention:  condoms Reviewed condoms:  yes  Suicidal or homicidal thoughts?   no Self injurious behaviors?  no   The following portions of the patient's history were reviewed and updated as appropriate: allergies, current medications, past family history, past medical history, past social history, past surgical history and problem list.  Physical Exam:  Vitals:   06/28/17 1103  BP: 129/80  Pulse: 81  Weight: 297 lb 3.2 oz (134.8 kg)  Height: 5' 9.29" (1.76 m)   BP 129/80 (BP Location: Right Arm, Patient Position: Sitting, Cuff Size: Large)   Pulse 81   Ht 5' 9.29" (1.76 m)   Wt 297 lb 3.2 oz (134.8 kg)   BMI 43.52 kg/m  Body mass index: body mass index is 43.52 kg/m. Blood pressure percentiles are 77 % systolic and 85 % diastolic based on the August 2017 AAP Clinical Practice Guideline. Blood pressure percentile targets: 90: 135/82, 95: 139/86, 95 + 12 mmHg: 151/98. This reading is in the Stage 1 hypertension range (BP >= 130/80).  Physical Exam  General:   alert, active, no acute distress. Well appearing   Skin:   warm, dry, no rashes or other lesions  Oral cavity:   lips, mucosa, and tongue normal without erythema or exudates; teeth and gums normal  Eyes:   sclerae white, pupils equal and reactive, EOMI  Nose: clear, no discharge  Neck:   supple, no LAD  Lungs:  clear to auscultation bilaterally, no wheezes or crackles, good air movement throughout  Heart:   regular rate and rhythm, S1, S2 normal, no murmur, click, rub or gallop   Abdomen:  soft, non-tender; bowel sounds normal; no masses,  no organomegaly  Extremities:   extremities normal, atraumatic, no cyanosis or  edema  Neuro:  normal without focal findings, mental status, speech normal    Assessment/Plan:  ADHD: - Reporting significant inattentiveness/daydreaming since starting Adderall, and seems related to when he takes the medication - no other significant side effects, and having issues with obvious issues when not taking medication - will switch to Vyvanse 50 mg daily and monitor for tolerance and side effects - plan to follow up in 1 month - Did not provide letter for disability as requested; discussed with patient that he is not disabled, and we need to better control his ADHD and any potential medication side effects. Fully anticipate that he will be able to work productively with appropriate pharmacologic treatment. Informed we can provide a note to his employer if needed informing them of his diagnosis and current treatment plan.  Follow-up:  1 month  Medical decision-making:  >15 minutes spent face to face with patient with more than 50% of appointment spent discussing diagnosis, management, follow-up, and reviewing of ADHD and medication side  effect.

## 2017-06-28 NOTE — Patient Instructions (Signed)
Stop adderall xr  Start vyvanse  Come back and see us in 1 month

## 2017-07-31 ENCOUNTER — Ambulatory Visit: Payer: Self-pay | Admitting: Pediatrics

## 2017-08-28 ENCOUNTER — Ambulatory Visit: Payer: Self-pay | Admitting: Pediatrics

## 2017-10-23 ENCOUNTER — Ambulatory Visit: Payer: Medicaid Other | Admitting: Pediatrics

## 2017-11-01 ENCOUNTER — Encounter: Payer: Self-pay | Admitting: Pediatrics

## 2017-11-01 ENCOUNTER — Ambulatory Visit (INDEPENDENT_AMBULATORY_CARE_PROVIDER_SITE_OTHER): Payer: Medicaid Other | Admitting: Pediatrics

## 2017-11-01 VITALS — BP 126/78 | HR 56 | Ht 69.29 in | Wt 294.2 lb

## 2017-11-01 DIAGNOSIS — F4322 Adjustment disorder with anxiety: Secondary | ICD-10-CM | POA: Diagnosis not present

## 2017-11-01 DIAGNOSIS — Z113 Encounter for screening for infections with a predominantly sexual mode of transmission: Secondary | ICD-10-CM | POA: Diagnosis not present

## 2017-11-01 DIAGNOSIS — R030 Elevated blood-pressure reading, without diagnosis of hypertension: Secondary | ICD-10-CM

## 2017-11-01 DIAGNOSIS — F909 Attention-deficit hyperactivity disorder, unspecified type: Secondary | ICD-10-CM

## 2017-11-01 MED ORDER — AMPHETAMINE-DEXTROAMPHET ER 30 MG PO CP24: 30.0000 mg | ORAL_CAPSULE | Freq: Every day | ORAL | 0 refills | Status: DC

## 2017-11-01 NOTE — Progress Notes (Signed)
THIS RECORD MAY CONTAIN CONFIDENTIAL INFORMATION THAT SHOULD NOT BE RELEASED WITHOUT REVIEW OF THE SERVICE PROVIDER.  Adolescent Medicine Consultation Follow-Up Visit [name]  is a 22 y.o. male referred by David Lo, MD here today for follow-up regarding ADHD, anxiety.    Last seen in [facility] on 06/28/17 for the above.  Plan at last visit included start vyvanse for longer day coverage.  Pertinent Labs? No Growth Chart Viewed? yes   History was provided by the patient.  Interpreter? no  PCP Confirmed?  yes  My Chart Activated?   no   Chief Complaint  Patient presents with  . Follow-up    HPI:    Moving soon to a new house with his mom. His 35 month old baby is doing well.  Taking adderall xr 30 mg daily. He did not start the vyvanse after the last visit as he didn't get a job and didn't feel like he needed it. He resumed taking the adderall xr in January per the controlled substances database. He is looking for a job. Put in an application at [employer] the other day.  No concerns with asthma.   Review of Systems  Constitutional: Negative for malaise/fatigue.  Eyes: Negative for double vision.  Respiratory: Negative for shortness of breath.   Cardiovascular: Negative for chest pain and palpitations.  Gastrointestinal: Negative for abdominal pain, constipation, diarrhea, nausea and vomiting.  Genitourinary: Negative for dysuria.  Musculoskeletal: Negative for joint pain and myalgias.  Skin: Negative for rash.  Neurological: Negative for dizziness and headaches.  Endo/Heme/Allergies: Does not bruise/bleed easily.     No LMP for male patient. No Known Allergies Outpatient Medications Prior to Visit  Medication Sig Dispense Refill  . albuterol (PROVENTIL HFA;VENTOLIN HFA) 108 (90 Base) MCG/ACT inhaler Inhale into the lungs every 6 (six) hours as needed for wheezing or shortness of breath.    . Vitamin D, Ergocalciferol, (DRISDOL) 50000 units CAPS  capsule Take 1 capsule (50,000 Units total) by mouth every 7 (seven) days. 12 capsule 0  . HYDROcodone-acetaminophen (NORCO/VICODIN) 5-325 MG tablet 1-2 tablets every 4-6 hrs as needed for pain (Patient not taking: Reported on 11/01/2017) 20 tablet 0  . lisdexamfetamine (VYVANSE) 50 MG capsule Take 1 capsule (50 mg total) daily by mouth. (Patient not taking: Reported on 11/01/2017) 30 capsule 0   No facility-administered medications prior to visit.      Patient Active Problem List   Diagnosis Date Noted  . Chronic pain of right knee 11/22/2016  . Elevated BP without diagnosis of hypertension 11/22/2016  . Acute lateral meniscus tear of right knee 05/22/2016  . Acanthosis nigricans 07/09/2014  . Allergic rhinitis 12/29/2013  . Adjustment disorder with anxiety 12/10/2013  . Vitamin D deficiency 07/12/2013  . ADHD (attention deficit hyperactivity disorder) 05/15/2013  . Obesity 05/15/2013     The following portions of the patient's history were reviewed and updated as appropriate: allergies, current medications, past family history, past medical history, past social history, past surgical history and problem list.  Physical Exam:  Vitals:   11/01/17 1603  BP: 126/78  Pulse: (!) 56  Weight: 294 lb 3.2 oz (133.4 kg)  Height: 5' 9.29" (1.76 m)   BP 126/78   Pulse (!) 56   Ht 5' 9.29" (1.76 m)   Wt 294 lb 3.2 oz (133.4 kg)   BMI 43.08 kg/m  Body mass index: body mass index is 43.08 kg/m. Blood pressure percentiles are 63 % systolic and 78 % diastolic  based on the August 2017 AAP Clinical Practice Guideline. Blood pressure percentile targets: 90: 135/82, 95: 139/86, 95 + 12 mmHg: 151/98. This reading is in the elevated blood pressure range (BP >= 120/80).   Physical Exam  Constitutional: He appears well-developed. No distress.  HENT:  Mouth/Throat: Oropharynx is clear and moist.  Neck: No thyromegaly present.  Acanthosis  Cardiovascular: Normal rate and regular rhythm.  No  murmur heard. Pulmonary/Chest: Breath sounds normal.  Abdominal: Soft. He exhibits no mass. There is no tenderness. There is no guarding.  Musculoskeletal: He exhibits no edema.  Lymphadenopathy:    He has no cervical adenopathy.  Neurological: He is alert.  Skin: Skin is warm. No rash noted.  Psychiatric: He has a normal mood and affect.    Assessment/Plan: 1. Attention deficit hyperactivity disorder (ADHD), unspecified ADHD type Will continue adderall xr as needed for focus driven tasks.  - amphetamine-dextroamphetamine (ADDERALL XR) 30 MG 24 hr capsule; Take 1 capsule (30 mg total) by mouth daily with breakfast.  Dispense: 60 capsule; Refill: 0  2. Adjustment disorder with anxiety PHQSADs negative for anxiety today.   3. Elevated BP without diagnosis of hypertension BP continues to be elevated. May need an antihypertensive medication started at next PCP visit. [facility]   4. Routine screening for STI (sexually transmitted infection) Per yearly protocol.  - C. trachomatis/N. gonorrhoeae RNA   BH screenings: PHQSADs and ASRS reviewed and indicated no anxiety. Screens discussed with patient and parent and adjustments to plan made accordingly.   Follow-up:  3 months  Medical decision-making:  >15 minutes spent face to face with patient with more than 50% of appointment spent discussing diagnosis, management, follow-up, and reviewing of ADHD, anxiety.

## 2017-11-02 LAB — C. TRACHOMATIS/N. GONORRHOEAE RNA
C. TRACHOMATIS RNA, TMA: NOT DETECTED
N. gonorrhoeae RNA, TMA: NOT DETECTED

## 2017-12-21 ENCOUNTER — Ambulatory Visit: Payer: Medicaid Other | Admitting: Pediatrics

## 2017-12-22 ENCOUNTER — Ambulatory Visit: Payer: Medicaid Other | Admitting: Family

## 2017-12-27 ENCOUNTER — Encounter: Payer: Self-pay | Admitting: Family

## 2017-12-27 ENCOUNTER — Ambulatory Visit (INDEPENDENT_AMBULATORY_CARE_PROVIDER_SITE_OTHER): Payer: Medicaid Other | Admitting: Family

## 2017-12-27 ENCOUNTER — Telehealth: Payer: Self-pay | Admitting: *Deleted

## 2017-12-27 DIAGNOSIS — F909 Attention-deficit hyperactivity disorder, unspecified type: Secondary | ICD-10-CM

## 2017-12-27 MED ORDER — AMPHETAMINE-DEXTROAMPHET ER 30 MG PO CP24: 30.0000 mg | ORAL_CAPSULE | Freq: Every day | ORAL | 0 refills | Status: DC

## 2017-12-27 NOTE — Telephone Encounter (Signed)
According to patient he was doing fine at today's visit. He hasn't been on fluoxetine in quite some time. We will not refill either for now unless we speak with patient about concerns.

## 2017-12-27 NOTE — Progress Notes (Signed)
THIS RECORD MAY CONTAIN CONFIDENTIAL INFORMATION THAT SHOULD NOT BE RELEASED WITHOUT REVIEW OF THE SERVICE PROVIDER.  Adolescent Medicine Consultation Follow-Up Visit [name]  is a 22 y.o. male referred by Ettefagh, Aron Baba, MD here today for follow-up regarding ADHD and medication management.    Last seen in [facility] on 11/01/17 for same.  Plan at last visit included Adderall XR 30 mg .  Pertinent Labs? No Growth Chart Viewed? no   History was provided by the patient.  Interpreter? no  PCP Confirmed?  yes  My Chart Activated?   no    Chief Complaint  Patient presents with  . Follow-up  . ADHD    HPI:    -taking adderall xr 30 mg every day but weekends.  -did not get job at [employer] -going back to school for [employer] work  -not sure when  -his son is doing well  Review of Systems  Constitutional: Negative for malaise/fatigue.  Eyes: Negative for double vision.  Respiratory: Negative for shortness of breath.   Cardiovascular: Negative for chest pain and palpitations.  Gastrointestinal: Negative for abdominal pain, constipation, diarrhea, nausea and vomiting.  Genitourinary: Negative for dysuria.  Musculoskeletal: Negative for joint pain and myalgias.  Skin: Negative for rash.  Neurological: Negative for dizziness and headaches.  Endo/Heme/Allergies: Does not bruise/bleed easily.     No LMP for male patient. No Known Allergies Outpatient Medications Prior to Visit  Medication Sig Dispense Refill  . albuterol (PROVENTIL HFA;VENTOLIN HFA) 108 (90 Base) MCG/ACT inhaler Inhale into the lungs every 6 (six) hours as needed for wheezing or shortness of breath.    . amphetamine-dextroamphetamine (ADDERALL XR) 30 MG 24 hr capsule Take 1 capsule (30 mg total) by mouth daily with breakfast. 60 capsule 0   No facility-administered medications prior to visit.      Patient Active Problem List   Diagnosis Date Noted  . Chronic pain of right  knee 11/22/2016  . Elevated BP without diagnosis of hypertension 11/22/2016  . Acute lateral meniscus tear of right knee 05/22/2016  . Acanthosis nigricans 07/09/2014  . Allergic rhinitis 12/29/2013  . Adjustment disorder with anxiety 12/10/2013  . Vitamin D deficiency 07/12/2013  . ADHD (attention deficit hyperactivity disorder) 05/15/2013  . Obesity 05/15/2013   Physical Exam:  Vitals:   12/27/17 1033  BP: 127/80  Pulse: 68  Weight: 298 lb (135.2 kg)  Height: 5' 9.5" (1.765 m)   BP 127/80   Pulse 68   Ht 5' 9.5" (1.765 m)   Wt 298 lb (135.2 kg)   BMI 43.38 kg/m  Body mass index: body mass index is 43.38 kg/m. Blood pressure percentiles are not available for patients who are 18 years or older.    Physical Exam  Constitutional: He appears well-developed. No distress.  HENT:  Mouth/Throat: Oropharynx is clear and moist.  Neck: No thyromegaly present.  Cardiovascular: Normal rate and regular rhythm.  No murmur heard. Pulmonary/Chest: Breath sounds normal.  Abdominal: Soft. He exhibits no mass. There is no tenderness. There is no guarding.  Musculoskeletal: He exhibits no edema.  Lymphadenopathy:    He has no cervical adenopathy.  Neurological: He is alert.  Skin: Skin is warm. No rash noted.  Psychiatric: He has a normal mood and affect.   Assessment/Plan: 1. Attention deficit hyperactivity disorder (ADHD), unspecified ADHD type -reviewed controlled substances database  -one month Rx given -BP is WNL today  - amphetamine-dextroamphetamine (ADDERALL XR) 30 MG 24 hr capsule; Take  1 capsule (30 mg total) by mouth daily with breakfast.  Dispense: 30 capsule; Refill: 0   Follow-up:  One month    Medical decision-making:  >15 minutes spent face to face with patient with more than 50% of appointment spent discussing diagnosis, management, follow-up, and reviewing ADHD medications, plans for focus-driven work/school.

## 2017-12-27 NOTE — Telephone Encounter (Signed)
Mom called requesting a refill for adderall 10 mg (to go with Adderall 30 mg when it doesn't "hold him") as well as fluoxetine (which I don't see on profile.)

## 2017-12-29 NOTE — Telephone Encounter (Signed)
Mother called again asking status of medication refill. Clarified with her we will need to speak with patient to see if he feels controlled on current med or if he would like the addrall 10 mg and fluoxetine. Awaiting call back from patient. Mom understanding due to patient age.

## 2018-02-02 ENCOUNTER — Ambulatory Visit: Payer: Medicaid Other | Admitting: Family

## 2018-02-13 ENCOUNTER — Encounter: Payer: Self-pay | Admitting: Family

## 2018-02-13 ENCOUNTER — Ambulatory Visit (INDEPENDENT_AMBULATORY_CARE_PROVIDER_SITE_OTHER): Payer: Medicaid Other | Admitting: Family

## 2018-02-13 DIAGNOSIS — F909 Attention-deficit hyperactivity disorder, unspecified type: Secondary | ICD-10-CM | POA: Diagnosis not present

## 2018-02-13 MED ORDER — AMPHETAMINE-DEXTROAMPHET ER 30 MG PO CP24: 30.0000 mg | ORAL_CAPSULE | Freq: Every day | ORAL | 0 refills | Status: DC

## 2018-02-13 MED ORDER — AMPHETAMINE-DEXTROAMPHETAMINE 10 MG PO TABS: ORAL_TABLET | ORAL | 0 refills | Status: DC

## 2018-02-13 NOTE — Progress Notes (Signed)
History was provided by the patient.  [name] is a 22 y.o. male who is here for ADHD.   PCP confirmed? Yes.    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  Eyes: Negative for double vision.  Respiratory: Negative for shortness of breath.   Cardiovascular: Negative for chest pain and palpitations.  Gastrointestinal: Negative for abdominal pain, constipation, diarrhea, nausea and vomiting.  Genitourinary: Negative for dysuria.  Musculoskeletal: Negative for joint pain and myalgias.  Skin: Negative for rash.  Neurological: Negative for dizziness and headaches.  Endo/Heme/Allergies: Does not bruise/bleed easily.    Patient Active Problem List   Diagnosis Date Noted  . Chronic pain of right knee 11/22/2016  . Elevated BP without diagnosis of hypertension 11/22/2016  . Acute lateral meniscus tear of right knee 05/22/2016  . Acanthosis nigricans 07/09/2014  . Allergic rhinitis 12/29/2013  . Adjustment disorder with anxiety 12/10/2013  . Vitamin D deficiency 07/12/2013  . ADHD (attention deficit hyperactivity disorder) 05/15/2013  . Obesity 05/15/2013    Current Outpatient Medications on File Prior to Visit  Medication Sig Dispense Refill  . albuterol (PROVENTIL HFA;VENTOLIN HFA) 108 (90 Base) MCG/ACT inhaler Inhale into the lungs every 6 (six) hours as needed for wheezing or shortness of breath.    . amphetamine-dextroamphetamine (ADDERALL XR) 30 MG 24 hr capsule Take 1 capsule (30 mg total) by mouth daily with breakfast. 30 capsule 0  . [DISCONTINUED] cloNIDine HCl (KAPVAY) 0.1 MG TB12 ER tablet Take 1 tablet (0.1 mg total) by mouth at bedtime. 30 tablet 2  . [DISCONTINUED] fluticasone (FLONASE) 50 MCG/ACT nasal spray Place 2 sprays  into both nostrils daily. 16 g 12  . [DISCONTINUED] loratadine (CLARITIN) 10 MG tablet Take 1 tablet (10 mg total) by mouth daily. 30 tablet 11  . [DISCONTINUED] pantoprazole (PROTONIX) 40 MG tablet Take 1 tablet (40 mg total) by mouth daily. 30 tablet 3   No current facility-administered medications on file prior to visit.     No Known Allergies  Physical Exam:    Vitals:   02/13/18 1623  BP: 128/84  Pulse: 92  Weight: 300 lb (136.1 kg)  Height: 5' 9.29" (1.76 m)    Blood pressure percentiles are not available for patients who are 18 years or older. No LMP for male patient.  Physical Exam  Constitutional: He appears well-developed. No distress.  HENT:  Mouth/Throat: Oropharynx is clear and moist.  Neck: No thyromegaly present.  Cardiovascular: Normal rate and regular rhythm.  No murmur heard. Pulmonary/Chest: Breath sounds normal.  Abdominal: Soft. He exhibits no mass. There is no tenderness. There is no guarding.  Musculoskeletal: He exhibits no edema.  Lymphadenopathy:    He has no cervical adenopathy.  Neurological: He is alert.  Skin: Skin is warm. No rash noted.  Psychiatric: He has a normal mood and affect.    Assessment/Plan: 1. Attention deficit hyperactivity disorder (ADHD), unspecified ADHD type -Continue with adderall XR 30 and Adderall 10 mg to take at 2pm  -return precautions given  -discussed that is is time for labs Lipids, Vit D, A1C. Will complete at next visit pending if he returns to PCP before then.   - amphetamine-dextroamphetamine (ADDERALL XR) 30 MG 24 hr capsule; Take 1  capsule (30 mg total) by mouth daily with breakfast.  Dispense: 30 capsule; Refill: 0

## 2018-03-02 ENCOUNTER — Encounter: Payer: Self-pay | Admitting: Family

## 2018-03-14 ENCOUNTER — Ambulatory Visit (INDEPENDENT_AMBULATORY_CARE_PROVIDER_SITE_OTHER): Payer: Medicaid Other | Admitting: Pediatrics

## 2018-03-14 ENCOUNTER — Encounter: Payer: Self-pay | Admitting: Pediatrics

## 2018-03-14 VITALS — BP 149/93 | HR 101 | Ht 69.69 in | Wt 303.2 lb

## 2018-03-14 DIAGNOSIS — F909 Attention-deficit hyperactivity disorder, unspecified type: Secondary | ICD-10-CM

## 2018-03-14 DIAGNOSIS — Z6841 Body Mass Index (BMI) 40.0 and over, adult: Secondary | ICD-10-CM | POA: Diagnosis not present

## 2018-03-14 DIAGNOSIS — Z0101 Encounter for examination of eyes and vision with abnormal findings: Secondary | ICD-10-CM | POA: Insufficient documentation

## 2018-03-14 DIAGNOSIS — R030 Elevated blood-pressure reading, without diagnosis of hypertension: Secondary | ICD-10-CM

## 2018-03-14 MED ORDER — AMPHETAMINE-DEXTROAMPHET ER 30 MG PO CP24: 30.0000 mg | ORAL_CAPSULE | Freq: Every day | ORAL | 0 refills | Status: DC

## 2018-03-14 MED ORDER — AMPHETAMINE-DEXTROAMPHETAMINE 10 MG PO TABS: 10.0000 mg | ORAL_TABLET | Freq: Every day | ORAL | 0 refills | Status: DC

## 2018-03-14 MED ORDER — AMPHETAMINE-DEXTROAMPHETAMINE 10 MG PO TABS: ORAL_TABLET | ORAL | 0 refills | Status: DC

## 2018-03-14 NOTE — Progress Notes (Signed)
History was provided by the patient.  [name] is a 22 y.o. male who is here for ADHD, elevated BP, morbid obesity.   PCP confirmed? Yes.    Ettefagh, Aron Baba, MD  HPI:  Taking adderall IR in the afternoons when he needs it. Takes the XR on work days. Not currently working. 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  No concerns today.  Baby is doing well.  Took adderall 10 mg about 1 pm so is in his system now. Anxiety has been ok lately.  Family is doing well.  Having some vision issues. Had glasses in middle school but lost them and hasn't been back.  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    Review of Systems  Constitutional: Negative for malaise/fatigue.  Eyes: Positive for blurred vision. Negative for double vision.  Respiratory: Negative for shortness of breath.   Cardiovascular: Negative for chest pain and palpitations.  Gastrointestinal: Negative for abdominal pain, constipation, diarrhea, nausea and vomiting.  Genitourinary: Negative for dysuria.  Musculoskeletal: Negative for joint pain and myalgias.  Skin: Negative for rash.  Neurological: Negative for dizziness and headaches.  Endo/Heme/Allergies: Does not bruise/bleed easily.     Patient Active Problem List   Diagnosis Date Noted  . Chronic pain of right knee 11/22/2016  . Elevated BP without diagnosis of hypertension 11/22/2016  . Acute lateral meniscus tear of right knee 05/22/2016  . Acanthosis nigricans 07/09/2014  . Allergic rhinitis 12/29/2013  . Adjustment disorder with anxiety 12/10/2013  . Vitamin D deficiency 07/12/2013  . ADHD (attention deficit hyperactivity disorder) 05/15/2013  . Obesity 05/15/2013    Current Outpatient Medications on File Prior to Visit  Medication Sig Dispense Refill  . albuterol (PROVENTIL HFA;VENTOLIN HFA) 108 (90 Base) MCG/ACT inhaler Inhale into the lungs every 6 (six) hours as needed for wheezing or shortness of breath.     . amphetamine-dextroamphetamine (ADDERALL XR) 30 MG 24 hr capsule Take 1 capsule (30 mg total) by mouth daily with breakfast. 30 capsule 0  . amphetamine-dextroamphetamine (ADDERALL) 10 MG tablet Take 1 tablet (10 mg) daily by mouth at 2pm. 30 tablet 0  . [DISCONTINUED] cloNIDine HCl (KAPVAY) 0.1 MG TB12 ER tablet Take 1 tablet (0.1 mg total) by mouth at bedtime. 30 tablet 2  . [DISCONTINUED] fluticasone (FLONASE) 50 MCG/ACT nasal spray Place 2 sprays into both nostrils daily. 16 g 12  . [DISCONTINUED] loratadine (CLARITIN) 10 MG tablet Take 1 tablet (10 mg total) by mouth daily. 30 tablet 11  . [DISCONTINUED] pantoprazole (PROTONIX) 40 MG tablet Take 1 tablet (40 mg total) by mouth daily. 30 tablet 3   No current facility-administered medications on file prior to visit.     No Known Allergies  Physical Exam:    Vitals:   03/14/18 1542  BP: (!) 149/93  Pulse: (!) 101  Weight: (!) 303 lb 3.2 oz (137.5 kg)  Height: 5' 9.69" (1.77 m)    Blood pressure percentiles are not available for patients who are 18 years or older. No LMP for male patient.  Physical Exam  Constitutional: He appears well-developed. No distress.  HENT:  Mouth/Throat: Oropharynx is clear and moist.  Neck: No thyromegaly present.  Acathnosis  Cardiovascular: Normal rate and regular rhythm.  No murmur heard. Pulmonary/Chest: Breath sounds normal.  Abdominal: Soft. He exhibits no mass. There is no tenderness. There is no guarding.  Musculoskeletal: He exhibits no edema.  Lymphadenopathy:    He has no cervical  adenopathy.  Neurological: He is alert.  Skin: Skin is warm. No rash noted.  Psychiatric: He has a normal mood and affect.     Assessment/Plan: 1. Class 3 severe obesity due to excess calories without serious comorbidity with body mass index (BMI) of 40.0 to 44.9 in adult [facility]) Repeat labs today. He has continued to gain weight associated with being sedentary and not currently having a daily job. We  discussed concerns with blood pressure today in the context of weight gain. Discussed trying to move more frequently.  - Comprehensive metabolic panel - VITAMIN D 25 Hydroxy (Vit-D Deficiency, Fractures) - Lipid panel - Hemoglobin A1c  2. Attention deficit hyperactivity disorder (ADHD), unspecified ADHD type Will continue same dose and schedule of adderall. Isn't needing it every day. Did take a dose today. Discussed my concern with his elevated blood pressure today. Will continue to monitor.  - amphetamine-dextroamphetamine (ADDERALL XR) 30 MG 24 hr capsule; Take 1 capsule (30 mg total) by mouth daily with breakfast.  Dispense: 30 capsule; Refill: 0 - amphetamine-dextroamphetamine (ADDERALL) 10 MG tablet; Take 1 tablet (10 mg) daily by mouth at 2pm.  Dispense: 30 tablet; Refill: 0 - amphetamine-dextroamphetamine (ADDERALL XR) 30 MG 24 hr capsule; Take 1 capsule (30 mg total) by mouth daily.  Dispense: 30 capsule; Refill: 0 - amphetamine-dextroamphetamine (ADDERALL) 10 MG tablet; Take 1 tablet (10 mg total) by mouth daily with breakfast.  Dispense: 30 tablet; Refill: 0  3. Elevated BP without diagnosis of hypertension Likely will end up with a HTN diagnosis if he doesn't lose weight and need medication therapy.   4. Failed vision screen Referred to optometry today- 20/40 vision on our screening here today.  - Ambulatory referral to Optometry

## 2018-03-15 LAB — COMPREHENSIVE METABOLIC PANEL
AG Ratio: 1.4 (calc) (ref 1.0–2.5)
ALT: 24 U/L (ref 8–46)
AST: 18 U/L (ref 12–32)
Albumin: 4.7 g/dL (ref 3.6–5.1)
Alkaline phosphatase (APISO): 56 U/L (ref 48–230)
BILIRUBIN TOTAL: 0.7 mg/dL (ref 0.2–1.1)
BUN: 11 mg/dL (ref 7–20)
CALCIUM: 9.8 mg/dL (ref 8.9–10.4)
CO2: 25 mmol/L (ref 20–32)
CREATININE: 0.82 mg/dL (ref 0.60–1.26)
Chloride: 103 mmol/L (ref 98–110)
Globulin: 3.3 g/dL (calc) (ref 2.1–3.5)
Glucose, Bld: 87 mg/dL (ref 65–99)
POTASSIUM: 4.2 mmol/L (ref 3.8–5.1)
Sodium: 139 mmol/L (ref 135–146)
Total Protein: 8 g/dL (ref 6.3–8.2)

## 2018-03-15 LAB — VITAMIN D 25 HYDROXY (VIT D DEFICIENCY, FRACTURES): VIT D 25 HYDROXY: 12 ng/mL — AB (ref 30–100)

## 2018-03-15 LAB — LIPID PANEL
CHOLESTEROL: 235 mg/dL — AB (ref <170)
HDL: 47 mg/dL (ref >45)
LDL CHOLESTEROL (CALC): 168 mg/dL — AB (ref <110)
Non-HDL Cholesterol (Calc): 188 mg/dL (calc) — ABNORMAL HIGH (ref <120)
TRIGLYCERIDES: 93 mg/dL — AB (ref <90)
Total CHOL/HDL Ratio: 5 (calc) — ABNORMAL HIGH (ref <5.0)

## 2018-03-15 LAB — HEMOGLOBIN A1C
HEMOGLOBIN A1C: 5.7 %{Hb} — AB (ref <5.7)
Mean Plasma Glucose: 117 (calc)
eAG (mmol/L): 6.5 (calc)

## 2018-03-16 ENCOUNTER — Ambulatory Visit: Payer: Medicaid Other | Admitting: Pediatrics

## 2018-03-22 ENCOUNTER — Other Ambulatory Visit: Payer: Self-pay | Admitting: Pediatrics

## 2018-03-22 DIAGNOSIS — R7303 Prediabetes: Secondary | ICD-10-CM

## 2018-03-22 DIAGNOSIS — E782 Mixed hyperlipidemia: Secondary | ICD-10-CM

## 2018-03-22 DIAGNOSIS — E559 Vitamin D deficiency, unspecified: Secondary | ICD-10-CM

## 2018-03-22 MED ORDER — METFORMIN HCL 500 MG PO TABS: 500.0000 mg | ORAL_TABLET | Freq: Two times a day (BID) | ORAL | 3 refills | Status: DC

## 2018-03-22 MED ORDER — VITAMIN D (ERGOCALCIFEROL) 1.25 MG (50000 UNIT) PO CAPS: 50000.0000 [IU] | ORAL_CAPSULE | ORAL | 0 refills | Status: DC

## 2018-06-05 ENCOUNTER — Telehealth: Payer: Self-pay

## 2018-06-05 ENCOUNTER — Other Ambulatory Visit: Payer: Self-pay | Admitting: Pediatrics

## 2018-06-05 DIAGNOSIS — F909 Attention-deficit hyperactivity disorder, unspecified type: Secondary | ICD-10-CM

## 2018-06-05 MED ORDER — AMPHETAMINE-DEXTROAMPHET ER 30 MG PO CP24: 30.0000 mg | ORAL_CAPSULE | Freq: Every day | ORAL | 0 refills | Status: DC

## 2018-06-05 MED ORDER — AMPHETAMINE-DEXTROAMPHETAMINE 10 MG PO TABS: ORAL_TABLET | ORAL | 0 refills | Status: DC

## 2018-06-05 NOTE — Telephone Encounter (Signed)
Mom asking for bridge refill of Adderall XR and Adderall 10 mg. Follow up scheduled for 11/4. All scripts on file filled.

## 2018-06-05 NOTE — Telephone Encounter (Signed)
14 day bridge provided.

## 2018-06-25 ENCOUNTER — Ambulatory Visit: Payer: Medicaid Other | Admitting: Pediatrics

## 2018-07-02 ENCOUNTER — Encounter: Payer: Self-pay | Admitting: Pediatrics

## 2018-07-02 ENCOUNTER — Ambulatory Visit (INDEPENDENT_AMBULATORY_CARE_PROVIDER_SITE_OTHER): Payer: Medicaid Other | Admitting: Pediatrics

## 2018-07-02 VITALS — BP 139/79 | HR 79 | Ht 70.0 in | Wt 310.4 lb

## 2018-07-02 DIAGNOSIS — F909 Attention-deficit hyperactivity disorder, unspecified type: Secondary | ICD-10-CM

## 2018-07-02 DIAGNOSIS — R7303 Prediabetes: Secondary | ICD-10-CM | POA: Diagnosis not present

## 2018-07-02 DIAGNOSIS — E782 Mixed hyperlipidemia: Secondary | ICD-10-CM | POA: Diagnosis not present

## 2018-07-02 DIAGNOSIS — Z6841 Body Mass Index (BMI) 40.0 and over, adult: Secondary | ICD-10-CM | POA: Diagnosis not present

## 2018-07-02 DIAGNOSIS — I10 Essential (primary) hypertension: Secondary | ICD-10-CM

## 2018-07-02 MED ORDER — AMPHETAMINE-DEXTROAMPHET ER 30 MG PO CP24: 30.0000 mg | ORAL_CAPSULE | Freq: Every day | ORAL | 0 refills | Status: DC

## 2018-07-02 MED ORDER — AMPHETAMINE-DEXTROAMPHETAMINE 10 MG PO TABS: ORAL_TABLET | ORAL | 0 refills | Status: DC

## 2018-07-02 MED ORDER — AMPHETAMINE-DEXTROAMPHETAMINE 10 MG PO TABS: 10.0000 mg | ORAL_TABLET | Freq: Every day | ORAL | 0 refills | Status: DC

## 2018-07-02 NOTE — Patient Instructions (Addendum)
Cut back on eating and drinking soda and tea.  Go back to gym and start walking more! This helped your pre-diabetes in the past.  We will conitnue to watch your blood pressure and cholesterol

## 2018-07-02 NOTE — Progress Notes (Signed)
History was provided by the patient.  [name] is a 22 y.o. male who is here for ADHD f/u.  Ettefagh, Aron Baba, MD   HPI:  Pt reports still taking adderall. 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  Nothing he is worried about today.  He did not pick up his metformin. He has taken it in the past and he says it made him feel funny and weak so he didn't get it.  Has not been exercising. He is drinking "a little of everything" these days. He is drinking a lot of soda and tea. He does note that when he used to work out he felt better and was happy that his pre-dm was gone.   Still living with mom.   His girlfriend is with him today; they are having a baby at the end of this month.   No LMP for male patient.  Review of Systems  Constitutional: Negative for malaise/fatigue.  Eyes: Negative for double vision.  Respiratory: Negative for shortness of breath.   Cardiovascular: Negative for chest pain and palpitations.  Gastrointestinal: Negative for abdominal pain, constipation, diarrhea, nausea and vomiting.  Genitourinary: Negative for dysuria.  Musculoskeletal: Negative for joint pain and myalgias.  Skin: Negative for rash.  Neurological: Negative for dizziness and headaches.  Endo/Heme/Allergies: Does not bruise/bleed easily.    Patient Active Problem List   Diagnosis Date Noted  . Failed vision screen 03/14/2018  . Chronic pain of right knee 11/22/2016  . Elevated BP without diagnosis of hypertension 11/22/2016  . Acute lateral meniscus tear of right knee 05/22/2016  . Acanthosis nigricans 07/09/2014  . Allergic rhinitis 12/29/2013  . Adjustment disorder with anxiety 12/10/2013  . Vitamin D deficiency 07/12/2013  . ADHD (attention deficit hyperactivity disorder) 05/15/2013  . Obesity 05/15/2013    Current Outpatient Medications on File Prior to Visit  Medication Sig Dispense Refill  . albuterol (PROVENTIL HFA;VENTOLIN HFA) 108 (90 Base) MCG/ACT inhaler Inhale into the  lungs every 6 (six) hours as needed for wheezing or shortness of breath.    . amphetamine-dextroamphetamine (ADDERALL XR) 30 MG 24 hr capsule Take 1 capsule (30 mg total) by mouth daily. 14 capsule 0  . amphetamine-dextroamphetamine (ADDERALL) 10 MG tablet Take 1 tablet (10 mg) daily by mouth at 2pm. 14 tablet 0  . Vitamin D, Ergocalciferol, (DRISDOL) 50000 units CAPS capsule Take 1 capsule (50,000 Units total) by mouth every 7 (seven) days. 8 capsule 0  . metFORMIN (GLUCOPHAGE) 500 MG tablet Take 1 tablet (500 mg total) by mouth 2 (two) times daily with a meal. (Patient not taking: Reported on 07/02/2018) 60 tablet 3  . [DISCONTINUED] cloNIDine HCl (KAPVAY) 0.1 MG TB12 ER tablet Take 1 tablet (0.1 mg total) by mouth at bedtime. 30 tablet 2  . [DISCONTINUED] fluticasone (FLONASE) 50 MCG/ACT nasal spray Place 2 sprays into both nostrils daily. 16 g 12  . [DISCONTINUED] loratadine (CLARITIN) 10 MG tablet Take 1 tablet (10 mg total) by mouth daily. 30 tablet 11  . [DISCONTINUED] pantoprazole (PROTONIX) 40 MG tablet Take 1 tablet (40 mg total) by mouth daily. 30 tablet 3   No current facility-administered medications on file prior to visit.     No Known Allergies   Physical Exam:    Vitals:   07/02/18 0950  BP: 139/79  Pulse: 79  Weight: (!) 310 lb 6.4 oz (140.8 kg)  Height: 5\' 10"  (1.778 m)    Growth percentile SmartLinks can only be used for patients less than  81 years old.  Physical Exam  Constitutional: He appears well-developed. No distress.  HENT:  Mouth/Throat: Oropharynx is clear and moist.  Neck: No thyromegaly present.  Cardiovascular: Normal rate and regular rhythm.  No murmur heard. Pulmonary/Chest: Breath sounds normal.  Abdominal: Soft. He exhibits no mass. There is no tenderness. There is no guarding.  Musculoskeletal: He exhibits no edema.  Lymphadenopathy:    He has no cervical adenopathy.  Neurological: He is alert.  Skin: Skin is warm. No rash noted.   Acanthosis  Psychiatric: He has a normal mood and affect.    Assessment/Plan: 1. Attention deficit hyperactivity disorder (ADHD), unspecified ADHD type Continue adderall xR 30 mg daily and 10 mg PRN in the afternoon.  - amphetamine-dextroamphetamine (ADDERALL XR) 30 MG 24 hr capsule; Take 1 capsule (30 mg total) by mouth daily.  Dispense: 30 capsule; Refill: 0 - amphetamine-dextroamphetamine (ADDERALL) 10 MG tablet; Take 1 tablet (10 mg) daily by mouth at 2pm.  Dispense: 30 tablet; Refill: 0 - amphetamine-dextroamphetamine (ADDERALL XR) 30 MG 24 hr capsule; Take 1 capsule (30 mg total) by mouth daily with breakfast.  Dispense: 30 capsule; Refill: 0 - amphetamine-dextroamphetamine (ADDERALL) 10 MG tablet; Take 1 tablet (10 mg total) by mouth daily with breakfast.  Dispense: 30 tablet; Refill: 0  2. Essential hypertension BP continues to be elevated. We discussed the importance of exercise on BP as well as limiting sodium intake. Does not need management with meds yet, but will monitor closely.   3. Body mass index (BMI) 40.0-44.9, adult (HCC) Continues with significant weight gain. Has multiple associated health conditions. He agrees that he will start drinking less sugar and working out more.   4. Prediabetes Does not like taking metformin as it makes him feel funny. Could consider GLP-1 medication if he is interested seriously in weight loss at next visit.   5. Mixed hyperlipidemia Will monitor. Discussed lifestyle changes today.

## 2018-09-07 ENCOUNTER — Telehealth: Payer: Self-pay | Admitting: Pediatrics

## 2018-09-07 NOTE — Telephone Encounter (Signed)
Mom called in requesting a med refill for patient, next appt is not until Feb 21st.

## 2018-09-11 ENCOUNTER — Telehealth: Payer: Self-pay

## 2018-09-11 ENCOUNTER — Other Ambulatory Visit: Payer: Self-pay | Admitting: Pediatrics

## 2018-09-11 DIAGNOSIS — F909 Attention-deficit hyperactivity disorder, unspecified type: Secondary | ICD-10-CM

## 2018-09-11 MED ORDER — AMPHETAMINE-DEXTROAMPHET ER 30 MG PO CP24: 30.0000 mg | ORAL_CAPSULE | Freq: Every day | ORAL | 0 refills | Status: DC

## 2018-09-11 NOTE — Telephone Encounter (Signed)
Done

## 2018-09-11 NOTE — Telephone Encounter (Signed)
Pt called asking for refill of Adderall XR 30 mg. Routing to provider.

## 2018-09-11 NOTE — Telephone Encounter (Signed)
Called and left generic VM stating medication has been refilled.

## 2018-09-12 ENCOUNTER — Telehealth: Payer: Self-pay | Admitting: *Deleted

## 2018-09-12 ENCOUNTER — Other Ambulatory Visit: Payer: Self-pay | Admitting: Family

## 2018-09-12 DIAGNOSIS — F909 Attention-deficit hyperactivity disorder, unspecified type: Secondary | ICD-10-CM

## 2018-09-12 MED ORDER — AMPHETAMINE-DEXTROAMPHETAMINE 10 MG PO TABS: ORAL_TABLET | ORAL | 0 refills | Status: DC

## 2018-09-12 NOTE — Telephone Encounter (Signed)
Mother called and also needs adderall 10 mg sent to [facility] at [address]. Also confirmed date of next appointment.

## 2018-10-03 ENCOUNTER — Ambulatory Visit: Payer: Medicaid Other | Admitting: Pediatrics

## 2018-11-01 ENCOUNTER — Ambulatory Visit (INDEPENDENT_AMBULATORY_CARE_PROVIDER_SITE_OTHER): Payer: Medicaid Other | Admitting: Pediatrics

## 2018-11-01 ENCOUNTER — Encounter: Payer: Self-pay | Admitting: Pediatrics

## 2018-11-01 ENCOUNTER — Other Ambulatory Visit: Payer: Self-pay

## 2018-11-01 VITALS — BP 135/78 | HR 95 | Ht 69.0 in | Wt 318.0 lb

## 2018-11-01 DIAGNOSIS — E559 Vitamin D deficiency, unspecified: Secondary | ICD-10-CM

## 2018-11-01 DIAGNOSIS — F909 Attention-deficit hyperactivity disorder, unspecified type: Secondary | ICD-10-CM

## 2018-11-01 DIAGNOSIS — I10 Essential (primary) hypertension: Secondary | ICD-10-CM

## 2018-11-01 DIAGNOSIS — L83 Acanthosis nigricans: Secondary | ICD-10-CM

## 2018-11-01 DIAGNOSIS — R7303 Prediabetes: Secondary | ICD-10-CM

## 2018-11-01 DIAGNOSIS — Z6841 Body Mass Index (BMI) 40.0 and over, adult: Secondary | ICD-10-CM

## 2018-11-01 MED ORDER — AMPHETAMINE-DEXTROAMPHET ER 30 MG PO CP24
30.0000 mg | ORAL_CAPSULE | Freq: Every day | ORAL | 0 refills | Status: DC
Start: 2018-11-01 — End: 2018-12-12

## 2018-11-01 MED ORDER — AMPHETAMINE-DEXTROAMPHETAMINE 10 MG PO TABS: ORAL_TABLET | ORAL | 0 refills | Status: DC

## 2018-11-01 NOTE — Assessment & Plan Note (Signed)
-   F/u with PCP and will likely need to consider antihypertensives

## 2018-11-01 NOTE — Assessment & Plan Note (Signed)
-   Checking A1c and f/u with PCP

## 2018-11-01 NOTE — Progress Notes (Signed)
  Subjective:     Patient ID: [name], male   DOB: 10-17-1995, 23 y.o.   MRN: [id number]  [name] is a 23 year old male presenting for ADHD follow-up.  He has been doing well since stating the Adderall 10 mg at 2 pm in addition to his Adderall XR 30 mg which he takes in the morning. He is also a diabetic on metformin and has a history of hypertension. Patient says he has been more focused and his co-workers and boss notice a difference in his performance. He only take his meds on weekdays when he works.     Objective:    General: obese, NAD with non-toxic appearance HEENT: normocephalic, atraumatic, moist mucous membranes Lungs: normal work of breathing Skin: warm, dry, no rashes or lesions, cap refill < 2 seconds Extremities: warm and well perfused, normal tone, no edema Psych: euthymic mood, congruent affect Neuro: non-tremulous, grossly intact throughout    Assessment:     [name] is presenting for ADHD f/u. He is tolerating his meds with good control of symptoms. He is unfortunately HTN which needs to be addressed by his PCP. PHQ-9 score is 0.    Plan:     ADHD (attention deficit hyperactivity disorder) - Continue Adderall as is  Prediabetes - Checking A1c and f/u with PCP  Essential hypertension - F/u with PCP and will likely need to consider antihypertensives  Durward Parcel, DO [facility], PGY-3

## 2018-11-01 NOTE — Assessment & Plan Note (Signed)
Continue Adderall as is. 

## 2018-11-01 NOTE — Patient Instructions (Addendum)
To get another refill of ADHD medications, we need you to see your primary care doctor about your high blood pressure. I have made this appointment for you today. Labs today. You can discuss them when you come for your blood pressure appointment.  We will see you in 3 months

## 2018-11-02 LAB — HEMOGLOBIN A1C
Hgb A1c MFr Bld: 5.7 % of total Hgb — ABNORMAL HIGH (ref <5.7)
Mean Plasma Glucose: 117 (calc)
eAG (mmol/L): 6.5 (calc)

## 2018-11-02 LAB — VITAMIN D 25 HYDROXY (VIT D DEFICIENCY, FRACTURES): Vit D, 25-Hydroxy: 8 ng/mL — ABNORMAL LOW (ref 30–100)

## 2018-11-05 ENCOUNTER — Other Ambulatory Visit: Payer: Self-pay | Admitting: Pediatrics

## 2018-11-05 DIAGNOSIS — E559 Vitamin D deficiency, unspecified: Secondary | ICD-10-CM

## 2018-11-05 MED ORDER — VITAMIN D (ERGOCALCIFEROL) 1.25 MG (50000 UNIT) PO CAPS: 50000.0000 [IU] | ORAL_CAPSULE | ORAL | 0 refills | Status: DC

## 2018-11-05 NOTE — Progress Notes (Signed)
I have reviewed the resident's note and plan of care and helped develop the plan as necessary.  

## 2018-11-13 ENCOUNTER — Ambulatory Visit: Payer: Medicaid Other | Admitting: Pediatrics

## 2018-12-12 ENCOUNTER — Other Ambulatory Visit: Payer: Self-pay | Admitting: Pediatrics

## 2018-12-12 ENCOUNTER — Telehealth: Payer: Self-pay | Admitting: *Deleted

## 2018-12-12 DIAGNOSIS — F909 Attention-deficit hyperactivity disorder, unspecified type: Secondary | ICD-10-CM

## 2018-12-12 MED ORDER — AMPHETAMINE-DEXTROAMPHET ER 30 MG PO CP24: 30.0000 mg | ORAL_CAPSULE | Freq: Every day | ORAL | 0 refills | Status: DC

## 2018-12-12 MED ORDER — AMPHETAMINE-DEXTROAMPHETAMINE 10 MG PO TABS: ORAL_TABLET | ORAL | 0 refills | Status: DC

## 2018-12-12 NOTE — Telephone Encounter (Signed)
Mom calling for refill for ADHD meds. He is out of meds.

## 2018-12-12 NOTE — Telephone Encounter (Signed)
Done

## 2018-12-13 NOTE — Telephone Encounter (Signed)
Called number on file, no answer, left generic VM stating medication was sent to the pharm.

## 2018-12-14 ENCOUNTER — Telehealth: Payer: Self-pay | Admitting: Pediatrics

## 2018-12-14 NOTE — Telephone Encounter (Signed)
Hi [name] -  One of our CNA's left you a voicemail yesterday, but I wanted to follow up via email just to make sure you received it. The appointment with [facility] on schedule for 4/27 has been canceled. You are on schedule for 02/04/2019 at 2pm for a BP check and follow up. If you would like to be seen for a follow up before then, please let me know. [facility] is completing visits virtually through WebEx, so you would not have to come to the clinic.  Best,  Franchot Gallo, B.S. Behavioral Health Coordinator  [facility]  [phone] - direct line [phone] - fax number

## 2018-12-17 ENCOUNTER — Ambulatory Visit: Payer: Medicaid Other | Admitting: Pediatrics

## 2019-01-21 ENCOUNTER — Other Ambulatory Visit: Payer: Self-pay

## 2019-01-21 ENCOUNTER — Other Ambulatory Visit: Payer: Self-pay | Admitting: Pediatrics

## 2019-01-21 DIAGNOSIS — F909 Attention-deficit hyperactivity disorder, unspecified type: Secondary | ICD-10-CM

## 2019-01-21 MED ORDER — AMPHETAMINE-DEXTROAMPHETAMINE 10 MG PO TABS: ORAL_TABLET | ORAL | 0 refills | Status: DC

## 2019-01-21 MED ORDER — AMPHETAMINE-DEXTROAMPHET ER 30 MG PO CP24: 30.0000 mg | ORAL_CAPSULE | Freq: Every day | ORAL | 0 refills | Status: DC

## 2019-01-21 NOTE — Telephone Encounter (Signed)
Likely adderall rx- sent.

## 2019-01-21 NOTE — Telephone Encounter (Signed)
Called number on file, no answer, no VM option avail. 

## 2019-01-21 NOTE — Telephone Encounter (Signed)
[name] left message on nurse line asking for refill on his medicine (name not specified). I returned call to number provided but no answer and no VM set up.

## 2019-02-04 ENCOUNTER — Other Ambulatory Visit: Payer: Self-pay

## 2019-02-04 ENCOUNTER — Ambulatory Visit: Payer: Medicaid Other | Admitting: Pediatrics

## 2019-02-21 ENCOUNTER — Other Ambulatory Visit: Payer: Self-pay

## 2019-02-21 DIAGNOSIS — F909 Attention-deficit hyperactivity disorder, unspecified type: Secondary | ICD-10-CM

## 2019-02-25 MED ORDER — AMPHETAMINE-DEXTROAMPHETAMINE 10 MG PO TABS: ORAL_TABLET | ORAL | 0 refills | Status: DC

## 2019-02-25 MED ORDER — AMPHETAMINE-DEXTROAMPHET ER 30 MG PO CP24: 30.0000 mg | ORAL_CAPSULE | Freq: Every day | ORAL | 0 refills | Status: DC

## 2019-03-27 ENCOUNTER — Other Ambulatory Visit: Payer: Self-pay | Admitting: Family

## 2019-03-27 DIAGNOSIS — F909 Attention-deficit hyperactivity disorder, unspecified type: Secondary | ICD-10-CM

## 2019-03-28 ENCOUNTER — Telehealth: Payer: Self-pay

## 2019-03-28 ENCOUNTER — Other Ambulatory Visit: Payer: Self-pay | Admitting: Family

## 2019-03-28 DIAGNOSIS — F909 Attention-deficit hyperactivity disorder, unspecified type: Secondary | ICD-10-CM

## 2019-03-28 MED ORDER — AMPHETAMINE-DEXTROAMPHET ER 30 MG PO CP24: 30.0000 mg | ORAL_CAPSULE | Freq: Every day | ORAL | 0 refills | Status: DC

## 2019-03-28 MED ORDER — AMPHETAMINE-DEXTROAMPHETAMINE 10 MG PO TABS: ORAL_TABLET | ORAL | 0 refills | Status: DC

## 2019-03-28 NOTE — Telephone Encounter (Signed)
[facility] on [address] does not have the RX that was sent cause the place that they store its lock is broken. Please resend RX to the [facility] on [address]. She will destroy the RX that was sent to their [facility]

## 2019-04-29 ENCOUNTER — Other Ambulatory Visit: Payer: Self-pay | Admitting: Family

## 2019-04-29 DIAGNOSIS — F909 Attention-deficit hyperactivity disorder, unspecified type: Secondary | ICD-10-CM

## 2019-04-30 MED ORDER — AMPHETAMINE-DEXTROAMPHETAMINE 10 MG PO TABS: ORAL_TABLET | ORAL | 0 refills | Status: DC

## 2019-04-30 MED ORDER — AMPHETAMINE-DEXTROAMPHET ER 30 MG PO CP24: 30.0000 mg | ORAL_CAPSULE | Freq: Every day | ORAL | 0 refills | Status: DC

## 2019-05-16 ENCOUNTER — Other Ambulatory Visit: Payer: Self-pay

## 2019-05-16 DIAGNOSIS — R6889 Other general symptoms and signs: Secondary | ICD-10-CM | POA: Diagnosis not present

## 2019-05-16 DIAGNOSIS — Z20822 Contact with and (suspected) exposure to covid-19: Secondary | ICD-10-CM

## 2019-05-17 LAB — NOVEL CORONAVIRUS, NAA: SARS-CoV-2, NAA: DETECTED — AB

## 2019-05-29 ENCOUNTER — Other Ambulatory Visit: Payer: Self-pay | Admitting: Family

## 2019-05-29 DIAGNOSIS — F909 Attention-deficit hyperactivity disorder, unspecified type: Secondary | ICD-10-CM

## 2019-05-30 ENCOUNTER — Other Ambulatory Visit: Payer: Self-pay | Admitting: Family

## 2019-05-30 DIAGNOSIS — F909 Attention-deficit hyperactivity disorder, unspecified type: Secondary | ICD-10-CM

## 2019-05-30 MED ORDER — AMPHETAMINE-DEXTROAMPHETAMINE 10 MG PO TABS: ORAL_TABLET | ORAL | 0 refills | Status: DC

## 2019-05-30 MED ORDER — AMPHETAMINE-DEXTROAMPHET ER 30 MG PO CP24: 30.0000 mg | ORAL_CAPSULE | Freq: Every day | ORAL | 0 refills | Status: DC

## 2019-06-28 ENCOUNTER — Other Ambulatory Visit: Payer: Self-pay | Admitting: Family

## 2019-06-28 DIAGNOSIS — F909 Attention-deficit hyperactivity disorder, unspecified type: Secondary | ICD-10-CM

## 2019-06-29 ENCOUNTER — Other Ambulatory Visit: Payer: Self-pay | Admitting: Family

## 2019-06-29 DIAGNOSIS — F909 Attention-deficit hyperactivity disorder, unspecified type: Secondary | ICD-10-CM

## 2019-06-30 NOTE — Telephone Encounter (Signed)
Sent MyChart message stating patient needs appointment.

## 2019-07-03 ENCOUNTER — Ambulatory Visit (INDEPENDENT_AMBULATORY_CARE_PROVIDER_SITE_OTHER): Payer: Medicaid Other | Admitting: Pediatrics

## 2019-07-03 DIAGNOSIS — J029 Acute pharyngitis, unspecified: Secondary | ICD-10-CM

## 2019-07-03 DIAGNOSIS — F4322 Adjustment disorder with anxiety: Secondary | ICD-10-CM | POA: Diagnosis not present

## 2019-07-03 DIAGNOSIS — F909 Attention-deficit hyperactivity disorder, unspecified type: Secondary | ICD-10-CM

## 2019-07-03 DIAGNOSIS — I10 Essential (primary) hypertension: Secondary | ICD-10-CM

## 2019-07-03 DIAGNOSIS — R7303 Prediabetes: Secondary | ICD-10-CM | POA: Diagnosis not present

## 2019-07-03 DIAGNOSIS — E559 Vitamin D deficiency, unspecified: Secondary | ICD-10-CM

## 2019-07-03 MED ORDER — AMPHETAMINE-DEXTROAMPHETAMINE 10 MG PO TABS: ORAL_TABLET | ORAL | 0 refills | Status: DC

## 2019-07-03 MED ORDER — VITAMIN D (ERGOCALCIFEROL) 1.25 MG (50000 UNIT) PO CAPS: 50000.0000 [IU] | ORAL_CAPSULE | ORAL | 0 refills | Status: DC

## 2019-07-03 MED ORDER — AMPHETAMINE-DEXTROAMPHET ER 30 MG PO CP24: 30.0000 mg | ORAL_CAPSULE | Freq: Every day | ORAL | 0 refills | Status: DC

## 2019-07-03 NOTE — Progress Notes (Signed)
THIS RECORD MAY CONTAIN CONFIDENTIAL INFORMATION THAT SHOULD NOT BE RELEASED WITHOUT REVIEW OF THE SERVICE PROVIDER.  Virtual Follow-Up Visit via Video Note  I connected with [name] 's patient  on 07/03/19 at  1:30 PM EST by a video enabled telemedicine application and verified that I am speaking with the correct person using two identifiers.    This patient visit was completed through the use of an audio/video or telephone encounter in the setting of the State of Emergency due to the COVID-19 Pandemic.  I discussed that the purpose of this telehealth visit is to provide medical care while limiting exposure to the novel coronavirus.       I discussed the limitations of evaluation and management by telemedicine and the availability of in person appointments.    The patient expressed understanding and agreed to proceed.   The patient was physically located at home in [place] or a state in which I am permitted to provide care. The patient and/or parent/guardian understood that s/he may incur co-pays and cost sharing, and agreed to the telemedicine visit. The visit was reasonable and appropriate under the circumstances given the patient's presentation at the time.   The patient and/or parent/guardian has been advised of the potential risks and limitations of this mode of treatment (including, but not limited to, the absence of in-person examination) and has agreed to be treated using telemedicine. The patient's/patient's family's questions regarding telemedicine have been answered.    As this visit was completed in an ambulatory virtual setting, the patient and/or parent/guardian has also been advised to contact their provider's office for worsening conditions, and seek emergency medical treatment and/or call 911 if the patient deems either necessary.   Team Care Documentation:  Team care documentation used during this visit? no Team care members present and location: No   [name] is a 23 y.o. male referred by Ettefagh, Paul Dykes, MD here today for follow-up of ADHD, anxiety, predm, htn.   Growth Chart Viewed? no  Previsit planning completed:  yes   History was provided by the patient.  PCP Confirmed?  yes  My Chart Activated?   no    Plan from Last Visit:   Continue adderall xr and regular in afternoon; needs to see PCP to review labs   Chief Complaint: Needs med refill   History of Present Illness:  Had COVID. He is doing well. Yesterday he got a sore throat but that is about it. Denies cough. Denies any fatigue or body aches.   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   ADHD sx "ok." Still stays on schedule with taking medications in the AM just when he works. Denies anxiety or depression sx. Sleeping well.   Thinks he has been more physically active. Isn't drinking soda frequently like he was before. Occasional juice. Eating smaller portions of greasy food.    No LMP for male patient.  Review of Systems  Constitutional: Negative for malaise/fatigue.  HENT: Positive for sore throat. Negative for congestion and sinus pain.   Eyes: Negative for double vision and discharge.  Respiratory: Negative for cough and shortness of breath.   Cardiovascular: Negative for chest pain and palpitations.  Gastrointestinal: Negative for abdominal pain, constipation, diarrhea, nausea and vomiting.  Genitourinary: Negative for dysuria.  Musculoskeletal: Negative for joint pain and myalgias.  Skin: Negative for rash.  Neurological: Negative for dizziness and headaches.  Endo/Heme/Allergies: Does not bruise/bleed easily.  No Known Allergies Outpatient Medications Prior to Visit  Medication Sig Dispense Refill  . albuterol (PROVENTIL HFA;VENTOLIN HFA) 108 (90 Base) MCG/ACT inhaler Inhale into the lungs every 6 (six) hours as needed for wheezing or shortness of breath.    . amphetamine-dextroamphetamine (ADDERALL XR)  30 MG 24 hr capsule Take 1 capsule (30 mg total) by mouth daily with breakfast. 30 capsule 0  . amphetamine-dextroamphetamine (ADDERALL) 10 MG tablet Take 1 tablet (10 mg) daily by mouth at 2pm. 30 tablet 0  . metFORMIN (GLUCOPHAGE) 500 MG tablet TK 1 T PO BID WITH MEAL    . Vitamin D, Ergocalciferol, (DRISDOL) 1.25 MG (50000 UT) CAPS capsule Take 1 capsule (50,000 Units total) by mouth every 7 (seven) days. 12 capsule 0   No facility-administered medications prior to visit.      Patient Active Problem List   Diagnosis Date Noted  . Body mass index (BMI) 40.0-44.9, adult 07/02/2018  . Mixed hyperlipidemia 07/02/2018  . Essential hypertension 07/02/2018  . Failed vision screen 03/14/2018  . Chronic pain of right knee 11/22/2016  . Acute lateral meniscus tear of right knee 05/22/2016  . Acanthosis nigricans 07/09/2014  . Prediabetes 07/09/2014  . Allergic rhinitis 12/29/2013  . Adjustment disorder with anxiety 12/10/2013  . Vitamin D deficiency 07/12/2013  . ADHD (attention deficit hyperactivity disorder) 05/15/2013  . Obesity 05/15/2013    Past Medical History:  Reviewed and updated?  yes Past Medical History:  Diagnosis Date  . ADHD (attention deficit hyperactivity disorder)   . Asthma    prn inhaler  . Lateral meniscus tear 02/2017   right    Family History: Reviewed and updated? yes Family History  Problem Relation Age of Onset  . Obesity Mother   . Sleep apnea Mother   . COPD Other   . Hyperlipidemia Other   . Hypertension Other     The following portions of the patient's history were reviewed and updated as appropriate: allergies, current medications, past family history, past medical history, past social history, past surgical history and problem list.  Visual Observations/Objective:   General Appearance: Well nourished well developed, in no apparent distress.  Eyes: conjunctiva no swelling or erythema ENT/Mouth: No hoarseness, No cough for duration of visit.   Neck: Supple  Respiratory: Respiratory effort normal, normal rate, no retractions or distress.   Cardio: Appears well-perfused, noncyanotic Musculoskeletal: no obvious deformity Skin: visible skin without rashes, ecchymosis, erythema Neuro: Awake and oriented X 3,  Psych:  normal affect, Insight and Judgment appropriate.    Assessment/Plan: 1. Adjustment disorder with anxiety Stable with no concern today   2. Attention deficit hyperactivity disorder (ADHD), unspecified ADHD type Continue adderall. Need to further evaluate htn in the setting of continued stimulant use.  - amphetamine-dextroamphetamine (ADDERALL) 10 MG tablet; Take 1 tablet (10 mg) daily by mouth at 2pm.  Dispense: 30 tablet; Refill: 0 - amphetamine-dextroamphetamine (ADDERALL XR) 30 MG 24 hr capsule; Take 1 capsule (30 mg total) by mouth daily with breakfast.  Dispense: 30 capsule; Refill: 0  3. Vitamin D deficiency Will repeat 50,000 iu daily  - Vitamin D, Ergocalciferol, (DRISDOL) 1.25 MG (50000 UT) CAPS capsule; Take 1 capsule (50,000 Units total) by mouth every 7 (seven) days.  Dispense: 12 capsule; Refill: 0  4. Prediabetes Not taking metformin, will repeat labs when he comes for visit.   5. Essential hypertension Likely needs tx with CCB if BP still elevated.   6. Sore throat Not likely to be COVID given that  he had it once recently. No other URI sx. We discussed tx symptoms with ibuprofen, salt water gargles. To call back if worsening.     I discussed the assessment and treatment plan with the patient and/or parent/guardian.  They were provided an opportunity to ask questions and all were answered.  They agreed with the plan and demonstrated an understanding of the instructions. They were advised to call back or seek an in-person evaluation in the emergency room if the symptoms worsen or if the condition fails to improve as anticipated.   Follow-up:   1 month in clinic   Medical decision-making:   I  spent 25 minutes on this telehealth visit inclusive of face-to-face video and care coordination time I was located off site during this encounter.   Alfonso Ramusaroline Hacker, FNP    CC: Ettefagh, Aron BabaKate Scott, MD, Ettefagh, Aron BabaKate Scott, MD

## 2019-08-02 ENCOUNTER — Other Ambulatory Visit: Payer: Self-pay

## 2019-08-02 DIAGNOSIS — E559 Vitamin D deficiency, unspecified: Secondary | ICD-10-CM

## 2019-08-02 DIAGNOSIS — F909 Attention-deficit hyperactivity disorder, unspecified type: Secondary | ICD-10-CM

## 2019-08-05 ENCOUNTER — Other Ambulatory Visit: Payer: Self-pay

## 2019-08-05 ENCOUNTER — Ambulatory Visit: Payer: Self-pay | Admitting: Pediatrics

## 2019-08-05 DIAGNOSIS — E559 Vitamin D deficiency, unspecified: Secondary | ICD-10-CM

## 2019-08-05 DIAGNOSIS — F909 Attention-deficit hyperactivity disorder, unspecified type: Secondary | ICD-10-CM

## 2019-08-05 NOTE — Telephone Encounter (Signed)
Working on trying to get him rescheduled. He no showed appointment today. He needs BP rechecked.

## 2019-08-08 ENCOUNTER — Other Ambulatory Visit: Payer: Self-pay | Admitting: Family

## 2019-08-08 ENCOUNTER — Other Ambulatory Visit: Payer: Self-pay

## 2019-08-08 ENCOUNTER — Ambulatory Visit (INDEPENDENT_AMBULATORY_CARE_PROVIDER_SITE_OTHER): Payer: Medicaid Other

## 2019-08-08 VITALS — BP 130/76 | HR 82 | Ht 70.67 in | Wt 324.2 lb

## 2019-08-08 DIAGNOSIS — F909 Attention-deficit hyperactivity disorder, unspecified type: Secondary | ICD-10-CM

## 2019-08-08 MED ORDER — AMPHETAMINE-DEXTROAMPHET ER 30 MG PO CP24: 30.0000 mg | ORAL_CAPSULE | Freq: Every day | ORAL | 0 refills | Status: DC

## 2019-08-08 MED ORDER — AMPHETAMINE-DEXTROAMPHETAMINE 10 MG PO TABS: ORAL_TABLET | ORAL | 0 refills | Status: DC

## 2019-08-08 NOTE — Progress Notes (Signed)
Pt here today for vitals check. Collaborated with NP- plan of care made. Pt needed BP check on file for further refills. Please send refills of both medications to pharmacy on file.

## 2019-09-06 ENCOUNTER — Other Ambulatory Visit: Payer: Self-pay | Admitting: Family

## 2019-09-06 DIAGNOSIS — F909 Attention-deficit hyperactivity disorder, unspecified type: Secondary | ICD-10-CM

## 2019-09-07 ENCOUNTER — Other Ambulatory Visit: Payer: Self-pay | Admitting: Family

## 2019-09-07 DIAGNOSIS — F909 Attention-deficit hyperactivity disorder, unspecified type: Secondary | ICD-10-CM

## 2019-09-07 MED ORDER — AMPHETAMINE-DEXTROAMPHETAMINE 10 MG PO TABS: ORAL_TABLET | ORAL | 0 refills | Status: DC

## 2019-09-07 MED ORDER — AMPHETAMINE-DEXTROAMPHET ER 30 MG PO CP24: 30.0000 mg | ORAL_CAPSULE | Freq: Every day | ORAL | 0 refills | Status: DC

## 2019-09-30 ENCOUNTER — Ambulatory Visit: Payer: Medicaid Other | Attending: Internal Medicine

## 2019-09-30 DIAGNOSIS — Z20822 Contact with and (suspected) exposure to covid-19: Secondary | ICD-10-CM

## 2019-10-01 LAB — NOVEL CORONAVIRUS, NAA: SARS-CoV-2, NAA: NOT DETECTED

## 2019-10-07 ENCOUNTER — Other Ambulatory Visit: Payer: Self-pay | Admitting: Family

## 2019-10-07 DIAGNOSIS — F909 Attention-deficit hyperactivity disorder, unspecified type: Secondary | ICD-10-CM

## 2019-10-07 MED ORDER — AMPHETAMINE-DEXTROAMPHET ER 30 MG PO CP24: 30.0000 mg | ORAL_CAPSULE | Freq: Every day | ORAL | 0 refills | Status: DC

## 2019-10-07 MED ORDER — AMPHETAMINE-DEXTROAMPHETAMINE 10 MG PO TABS: ORAL_TABLET | ORAL | 0 refills | Status: DC

## 2019-11-04 ENCOUNTER — Other Ambulatory Visit: Payer: Self-pay | Admitting: Family

## 2019-11-04 DIAGNOSIS — F909 Attention-deficit hyperactivity disorder, unspecified type: Secondary | ICD-10-CM

## 2019-11-05 NOTE — Telephone Encounter (Signed)
Needs onsite visit for vitals and labs prior to refill.

## 2019-11-05 NOTE — Telephone Encounter (Signed)
Patient to call to schedule when he is avail to find transportation.

## 2019-11-05 NOTE — Telephone Encounter (Signed)
Sent patient a MyChart message

## 2019-11-07 ENCOUNTER — Other Ambulatory Visit: Payer: Self-pay | Admitting: Pediatrics

## 2019-11-07 DIAGNOSIS — E782 Mixed hyperlipidemia: Secondary | ICD-10-CM

## 2019-11-07 DIAGNOSIS — E559 Vitamin D deficiency, unspecified: Secondary | ICD-10-CM

## 2019-11-07 DIAGNOSIS — I10 Essential (primary) hypertension: Secondary | ICD-10-CM

## 2019-11-07 DIAGNOSIS — R7303 Prediabetes: Secondary | ICD-10-CM

## 2019-11-08 ENCOUNTER — Other Ambulatory Visit: Payer: Self-pay | Admitting: Family

## 2019-11-08 ENCOUNTER — Other Ambulatory Visit: Payer: Self-pay

## 2019-11-08 ENCOUNTER — Ambulatory Visit (INDEPENDENT_AMBULATORY_CARE_PROVIDER_SITE_OTHER): Payer: Medicaid Other | Admitting: *Deleted

## 2019-11-08 DIAGNOSIS — E559 Vitamin D deficiency, unspecified: Secondary | ICD-10-CM | POA: Diagnosis not present

## 2019-11-08 DIAGNOSIS — R7303 Prediabetes: Secondary | ICD-10-CM

## 2019-11-08 DIAGNOSIS — I10 Essential (primary) hypertension: Secondary | ICD-10-CM

## 2019-11-08 DIAGNOSIS — F909 Attention-deficit hyperactivity disorder, unspecified type: Secondary | ICD-10-CM

## 2019-11-08 DIAGNOSIS — E782 Mixed hyperlipidemia: Secondary | ICD-10-CM | POA: Diagnosis not present

## 2019-11-09 ENCOUNTER — Other Ambulatory Visit: Payer: Self-pay | Admitting: Family

## 2019-11-09 DIAGNOSIS — F909 Attention-deficit hyperactivity disorder, unspecified type: Secondary | ICD-10-CM

## 2019-11-09 LAB — COMPREHENSIVE METABOLIC PANEL WITH GFR
AG Ratio: 1.5 (calc) (ref 1.0–2.5)
ALT: 36 U/L (ref 9–46)
AST: 27 U/L (ref 10–40)
Albumin: 4.7 g/dL (ref 3.6–5.1)
Alkaline phosphatase (APISO): 56 U/L (ref 36–130)
BUN: 10 mg/dL (ref 7–25)
CO2: 17 mmol/L — ABNORMAL LOW (ref 20–32)
Calcium: 9.6 mg/dL (ref 8.6–10.3)
Chloride: 104 mmol/L (ref 98–110)
Creat: 0.86 mg/dL (ref 0.60–1.35)
Globulin: 3.1 g/dL (ref 1.9–3.7)
Glucose, Bld: 129 mg/dL — ABNORMAL HIGH (ref 65–99)
Potassium: 4.1 mmol/L (ref 3.5–5.3)
Sodium: 139 mmol/L (ref 135–146)
Total Bilirubin: 0.6 mg/dL (ref 0.2–1.2)
Total Protein: 7.8 g/dL (ref 6.1–8.1)

## 2019-11-09 LAB — PROTEIN / CREATININE RATIO, URINE
Creatinine, Urine: 328 mg/dL — ABNORMAL HIGH (ref 20–320)
Protein/Creat Ratio: 85 mg/g creat (ref 22–128)
Protein/Creatinine Ratio: 0.085 mg/mg creat (ref 0.022–0.12)
Total Protein, Urine: 28 mg/dL — ABNORMAL HIGH (ref 5–25)

## 2019-11-09 LAB — VITAMIN D 25 HYDROXY (VIT D DEFICIENCY, FRACTURES): Vit D, 25-Hydroxy: 11 ng/mL — ABNORMAL LOW (ref 30–100)

## 2019-11-09 LAB — HEMOGLOBIN A1C
Hgb A1c MFr Bld: 5.2 % of total Hgb (ref <5.7)
Mean Plasma Glucose: 103 (calc)
eAG (mmol/L): 5.7 (calc)

## 2019-11-09 LAB — LIPID PANEL
Cholesterol: 213 mg/dL — ABNORMAL HIGH
HDL: 41 mg/dL
LDL Cholesterol (Calc): 149 mg/dL — ABNORMAL HIGH
Non-HDL Cholesterol (Calc): 172 mg/dL — ABNORMAL HIGH
Total CHOL/HDL Ratio: 5.2 (calc) — ABNORMAL HIGH
Triglycerides: 113 mg/dL

## 2019-11-11 MED ORDER — AMPHETAMINE-DEXTROAMPHETAMINE 10 MG PO TABS: ORAL_TABLET | ORAL | 0 refills | Status: DC

## 2019-11-11 MED ORDER — AMPHETAMINE-DEXTROAMPHET ER 30 MG PO CP24: 30.0000 mg | ORAL_CAPSULE | Freq: Every day | ORAL | 0 refills | Status: DC

## 2019-11-11 NOTE — Progress Notes (Signed)
Patient came in for labs Hgb A1c, Vitamin D, Lipid panel, CMP and Protein creatinine ratio. Pt also got labs done. Labs ordered by Alfonso Ramus. Successful collection.

## 2019-11-12 ENCOUNTER — Other Ambulatory Visit: Payer: Self-pay | Admitting: Pediatrics

## 2019-11-12 DIAGNOSIS — Z7689 Persons encountering health services in other specified circumstances: Secondary | ICD-10-CM

## 2019-12-12 ENCOUNTER — Other Ambulatory Visit: Payer: Self-pay

## 2019-12-12 DIAGNOSIS — F909 Attention-deficit hyperactivity disorder, unspecified type: Secondary | ICD-10-CM

## 2019-12-13 ENCOUNTER — Other Ambulatory Visit: Payer: Self-pay

## 2019-12-13 DIAGNOSIS — F909 Attention-deficit hyperactivity disorder, unspecified type: Secondary | ICD-10-CM

## 2019-12-15 MED ORDER — AMPHETAMINE-DEXTROAMPHET ER 30 MG PO CP24: 30.0000 mg | ORAL_CAPSULE | Freq: Every day | ORAL | 0 refills | Status: DC

## 2019-12-15 MED ORDER — AMPHETAMINE-DEXTROAMPHETAMINE 10 MG PO TABS: ORAL_TABLET | ORAL | 0 refills | Status: DC

## 2019-12-23 ENCOUNTER — Other Ambulatory Visit: Payer: Self-pay

## 2019-12-23 ENCOUNTER — Encounter: Payer: Self-pay | Admitting: Internal Medicine

## 2019-12-23 ENCOUNTER — Ambulatory Visit: Payer: Medicaid Other | Admitting: Internal Medicine

## 2019-12-23 VITALS — BP 121/74 | HR 78 | Temp 98.2°F | Ht 71.0 in | Wt 313.8 lb

## 2019-12-23 DIAGNOSIS — I10 Essential (primary) hypertension: Secondary | ICD-10-CM | POA: Diagnosis not present

## 2019-12-23 DIAGNOSIS — R519 Headache, unspecified: Secondary | ICD-10-CM | POA: Insufficient documentation

## 2019-12-23 DIAGNOSIS — Z6841 Body Mass Index (BMI) 40.0 and over, adult: Secondary | ICD-10-CM

## 2019-12-23 DIAGNOSIS — E559 Vitamin D deficiency, unspecified: Secondary | ICD-10-CM

## 2019-12-23 MED ORDER — VITAMIN D (ERGOCALCIFEROL) 1.25 MG (50000 UNIT) PO CAPS: 50000.0000 [IU] | ORAL_CAPSULE | ORAL | 0 refills | Status: DC

## 2019-12-23 NOTE — Patient Instructions (Signed)
It was a pleasure to see you today Mr. Alcock. Please make the following changes:  For your headache -please try using tylenol on an as needed basis   -please work on lifestyle modification of walking and dietary changes. I have referred you to dietician as well.   If you have any questions or concerns, please call our clinic at [phone] between 9am-5pm and after hours call [phone] and ask for the internal medicine resident on call. If you feel you are having a medical emergency please call 911.   Thank you, we look forward to help you remain healthy!  Lorenso Courier, MD Internal Medicine PGY3

## 2019-12-23 NOTE — Assessment & Plan Note (Signed)
Patient's last vitamin D level in March 2021 was 11.  Refilled vitamin D 50,000 units/week.

## 2019-12-23 NOTE — Progress Notes (Signed)
CC: elevated blood pressure   HPI:  [name] is a 24 y.o. with pmh below presents to establish care and to be followed up for elevated blood pressure and to assess kidney function. Please see problem based charting for evaluation, assessment, and plan.  Past Medical History:  Diagnosis Date  . ADHD (attention deficit hyperactivity disorder)   . Asthma    prn inhaler  . Lateral meniscus tear 02/2017   right   Review of Systems:    Review of Systems  Constitutional: Negative for chills and fever.  Respiratory: Negative for cough and shortness of breath.   Gastrointestinal: Negative for nausea and vomiting.  Musculoskeletal: Positive for joint pain (right knee from prior surgery). Negative for myalgias.  Neurological: Positive for headaches. Negative for dizziness.   Physical Exam:  Vitals:   12/23/19 0926  BP: 121/74  Pulse: 78  Temp: 98.2 F (36.8 C)  TempSrc: Oral  SpO2: 100%  Weight: (!) 313 lb 12.8 oz (142.3 kg)  Height: 5\' 11"  (1.803 m)   Physical Exam  Constitutional: Appears well-developed and well-nourished. No distress.  HENT:  Head: Normocephalic and atraumatic.  Eyes: Conjunctivae are normal.  Cardiovascular: Normal rate, regular rhythm and normal heart sounds.  Respiratory: Effort normal and breath sounds normal. No respiratory distress. No wheezes.  GI: Soft. Bowel sounds are normal. No distension. There is no tenderness.  Musculoskeletal: No edema. 5/5 strength in bilateral lower extremity. Mild tenderness to palpation on lateral aspect of right knee. No warmth or inflammation noted Neurological: Is alert.  Skin: Not diaphoretic. No erythema.  Psychiatric: Normal mood and affect. Behavior is normal. Judgment and thought content normal.   Social History   Socioeconomic History  . Marital status: Single    Spouse name: Not on file  . Number of children: Not on file  . Years of education: Not on file  . Highest education level: Not on  file  Occupational History  . Not on file  Tobacco Use  . Smoking status: Light Tobacco Smoker    Years: 1.00    Types: Cigars  . Smokeless tobacco: Never Used  . Tobacco comment: 1 per 2 days  Substance and Sexual Activity  . Alcohol use: No    Alcohol/week: 0.0 standard drinks  . Drug use: No  . Sexual activity: Yes    Birth control/protection: Abstinence  Other Topics Concern  . Not on file  Social History Narrative   ** Merged History Encounter **       Social Determinants of Health   Financial Resource Strain:   . Difficulty of Paying Living Expenses:   Food Insecurity:   . Worried About Charity fundraiser in the Last Year:   . Arboriculturist in the Last Year:   Transportation Needs:   . Film/video editor (Medical):   Marland Kitchen Lack of Transportation (Non-Medical):   Physical Activity:   . Days of Exercise per Week:   . Minutes of Exercise per Session:   Stress:   . Feeling of Stress :   Social Connections:   . Frequency of Communication with Friends and Family:   . Frequency of Social Gatherings with Friends and Family:   . Attends Religious Services:   . Active Member of Clubs or Organizations:   . Attends Archivist Meetings:   Marland Kitchen Marital Status:    Family History  Problem Relation Age of Onset  . Obesity Mother   . Sleep apnea  Mother   . Diabetes Mother   . Hypertension Mother   . COPD Other   . Hyperlipidemia Other   . Hypertension Other   . Diabetes Maternal Grandmother   . Hypertension Maternal Grandmother   . Diabetes Paternal Grandfather      Assessment & Plan:   See Encounters Tab for problem based charting.  Patient discussed with Dr. Rogelia Boga

## 2019-12-23 NOTE — Assessment & Plan Note (Signed)
The patients blood pressure during this visit is within normal range 121/74.   -counseled on physical exercise and diet -dietician referral

## 2019-12-23 NOTE — Assessment & Plan Note (Signed)
Patient states that he has been having headaches 3-4 times per week for the past 1 month. He notes that the headache is 5/10 intensity, located in the frontal region of his head. Lasts 1hr-few hrs. Does not note any changes with bright light or loud noises.   Assessment and plan  Likely tension headache that should resolve with over-the-counter pain medication.  Recommended patient take Tylenol on as-needed basis.  Recommended he return to clinic if the headaches get worse.

## 2019-12-24 NOTE — Progress Notes (Signed)
Internal Medicine Clinic Attending  Case discussed with Dr. Chundi at the time of the visit.  We reviewed the resident's history and exam and pertinent patient test results.  I agree with the assessment, diagnosis, and plan of care documented in the resident's note. 

## 2019-12-26 ENCOUNTER — Telehealth: Payer: Self-pay | Admitting: Dietician

## 2019-12-26 ENCOUNTER — Other Ambulatory Visit: Payer: Self-pay | Admitting: Dietician

## 2019-12-26 DIAGNOSIS — Z6841 Body Mass Index (BMI) 40.0 and over, adult: Secondary | ICD-10-CM

## 2019-12-26 NOTE — Telephone Encounter (Signed)
Tried calling this patient to make him aware of referral to [facility] office([facility]).  Their voicemail box is not set up. I was unable to leave a message

## 2019-12-26 NOTE — Progress Notes (Signed)
Referral request

## 2020-01-09 ENCOUNTER — Encounter: Payer: Self-pay | Admitting: Pediatrics

## 2020-01-15 ENCOUNTER — Other Ambulatory Visit: Payer: Self-pay

## 2020-01-15 DIAGNOSIS — F909 Attention-deficit hyperactivity disorder, unspecified type: Secondary | ICD-10-CM

## 2020-01-15 MED ORDER — AMPHETAMINE-DEXTROAMPHET ER 30 MG PO CP24: 30.0000 mg | ORAL_CAPSULE | Freq: Every day | ORAL | 0 refills | Status: DC

## 2020-01-15 MED ORDER — AMPHETAMINE-DEXTROAMPHETAMINE 10 MG PO TABS: ORAL_TABLET | ORAL | 0 refills | Status: DC

## 2020-01-29 ENCOUNTER — Encounter: Payer: Self-pay | Admitting: *Deleted

## 2020-02-15 ENCOUNTER — Other Ambulatory Visit: Payer: Self-pay

## 2020-02-15 DIAGNOSIS — F909 Attention-deficit hyperactivity disorder, unspecified type: Secondary | ICD-10-CM

## 2020-02-17 ENCOUNTER — Other Ambulatory Visit: Payer: Self-pay

## 2020-02-17 ENCOUNTER — Other Ambulatory Visit: Payer: Self-pay | Admitting: Internal Medicine

## 2020-02-17 DIAGNOSIS — E559 Vitamin D deficiency, unspecified: Secondary | ICD-10-CM

## 2020-02-17 DIAGNOSIS — F909 Attention-deficit hyperactivity disorder, unspecified type: Secondary | ICD-10-CM

## 2020-02-17 MED ORDER — AMPHETAMINE-DEXTROAMPHET ER 30 MG PO CP24: 30.0000 mg | ORAL_CAPSULE | Freq: Every day | ORAL | 0 refills | Status: DC

## 2020-02-17 MED ORDER — VITAMIN D (ERGOCALCIFEROL) 1.25 MG (50000 UNIT) PO CAPS: 50000.0000 [IU] | ORAL_CAPSULE | ORAL | 0 refills | Status: DC

## 2020-02-17 MED ORDER — AMPHETAMINE-DEXTROAMPHETAMINE 10 MG PO TABS: ORAL_TABLET | ORAL | 0 refills | Status: DC

## 2020-02-26 ENCOUNTER — Ambulatory Visit (INDEPENDENT_AMBULATORY_CARE_PROVIDER_SITE_OTHER): Payer: Medicaid Other | Admitting: Bariatrics

## 2020-02-26 NOTE — Addendum Note (Signed)
Addended by: Neomia Dear on: 02/26/2020 05:02 PM   Modules accepted: Orders

## 2020-03-11 ENCOUNTER — Ambulatory Visit (INDEPENDENT_AMBULATORY_CARE_PROVIDER_SITE_OTHER): Payer: Medicaid Other | Admitting: Bariatrics

## 2020-03-17 ENCOUNTER — Other Ambulatory Visit: Payer: Self-pay

## 2020-03-17 DIAGNOSIS — F909 Attention-deficit hyperactivity disorder, unspecified type: Secondary | ICD-10-CM

## 2020-03-17 MED ORDER — AMPHETAMINE-DEXTROAMPHETAMINE 10 MG PO TABS: ORAL_TABLET | ORAL | 0 refills | Status: DC

## 2020-03-17 MED ORDER — AMPHETAMINE-DEXTROAMPHET ER 30 MG PO CP24: 30.0000 mg | ORAL_CAPSULE | Freq: Every day | ORAL | 0 refills | Status: DC

## 2020-04-16 ENCOUNTER — Other Ambulatory Visit: Payer: Self-pay

## 2020-04-16 DIAGNOSIS — F909 Attention-deficit hyperactivity disorder, unspecified type: Secondary | ICD-10-CM

## 2020-04-16 MED ORDER — AMPHETAMINE-DEXTROAMPHET ER 30 MG PO CP24: 30.0000 mg | ORAL_CAPSULE | Freq: Every day | ORAL | 0 refills | Status: DC

## 2020-04-16 MED ORDER — AMPHETAMINE-DEXTROAMPHETAMINE 10 MG PO TABS: ORAL_TABLET | ORAL | 0 refills | Status: DC

## 2020-05-16 ENCOUNTER — Other Ambulatory Visit: Payer: Self-pay

## 2020-05-16 DIAGNOSIS — F909 Attention-deficit hyperactivity disorder, unspecified type: Secondary | ICD-10-CM

## 2020-05-17 ENCOUNTER — Other Ambulatory Visit: Payer: Self-pay

## 2020-05-17 DIAGNOSIS — F909 Attention-deficit hyperactivity disorder, unspecified type: Secondary | ICD-10-CM

## 2020-05-18 ENCOUNTER — Other Ambulatory Visit: Payer: Self-pay

## 2020-05-18 DIAGNOSIS — F909 Attention-deficit hyperactivity disorder, unspecified type: Secondary | ICD-10-CM

## 2020-05-18 MED ORDER — AMPHETAMINE-DEXTROAMPHET ER 30 MG PO CP24: 30.0000 mg | ORAL_CAPSULE | Freq: Every day | ORAL | 0 refills | Status: DC

## 2020-05-18 MED ORDER — AMPHETAMINE-DEXTROAMPHETAMINE 10 MG PO TABS: ORAL_TABLET | ORAL | 0 refills | Status: DC

## 2020-06-17 ENCOUNTER — Other Ambulatory Visit: Payer: Self-pay

## 2020-06-17 DIAGNOSIS — F909 Attention-deficit hyperactivity disorder, unspecified type: Secondary | ICD-10-CM

## 2020-06-17 MED ORDER — AMPHETAMINE-DEXTROAMPHETAMINE 10 MG PO TABS: ORAL_TABLET | ORAL | 0 refills | Status: DC

## 2020-06-17 MED ORDER — AMPHETAMINE-DEXTROAMPHET ER 30 MG PO CP24: 30.0000 mg | ORAL_CAPSULE | Freq: Every day | ORAL | 0 refills | Status: DC

## 2020-07-17 ENCOUNTER — Other Ambulatory Visit: Payer: Self-pay

## 2020-07-17 DIAGNOSIS — F909 Attention-deficit hyperactivity disorder, unspecified type: Secondary | ICD-10-CM

## 2020-07-18 ENCOUNTER — Other Ambulatory Visit: Payer: Self-pay

## 2020-07-18 DIAGNOSIS — F909 Attention-deficit hyperactivity disorder, unspecified type: Secondary | ICD-10-CM

## 2020-07-20 MED ORDER — AMPHETAMINE-DEXTROAMPHETAMINE 10 MG PO TABS: ORAL_TABLET | ORAL | 0 refills | Status: DC

## 2020-07-20 MED ORDER — AMPHETAMINE-DEXTROAMPHET ER 30 MG PO CP24: 30.0000 mg | ORAL_CAPSULE | Freq: Every day | ORAL | 0 refills | Status: DC

## 2020-08-10 ENCOUNTER — Encounter (HOSPITAL_COMMUNITY): Payer: Self-pay | Admitting: Emergency Medicine

## 2020-08-10 ENCOUNTER — Emergency Department (HOSPITAL_COMMUNITY)
Admission: EM | Admit: 2020-08-10 | Discharge: 2020-08-10 | Disposition: A | Discharge status: Home / Self Care | Payer: Medicaid Other | Attending: Emergency Medicine | Admitting: Emergency Medicine

## 2020-08-10 ENCOUNTER — Other Ambulatory Visit: Payer: Self-pay

## 2020-08-10 DIAGNOSIS — Z5321 Procedure and treatment not carried out due to patient leaving prior to being seen by health care provider: Secondary | ICD-10-CM | POA: Diagnosis not present

## 2020-08-10 DIAGNOSIS — Z20822 Contact with and (suspected) exposure to covid-19: Secondary | ICD-10-CM | POA: Diagnosis present

## 2020-08-10 LAB — RESP PANEL BY RT-PCR (FLU A&B, COVID) ARPGX2
Influenza A by PCR: NEGATIVE
Influenza B by PCR: NEGATIVE
SARS Coronavirus 2 by RT PCR: NEGATIVE

## 2020-08-10 NOTE — ED Triage Notes (Signed)
Patient requesting Covid 19 test , reports exposure to family members that are Covid + , denies any symptoms /no fever , respirations unlabored.

## 2020-08-10 NOTE — ED Notes (Signed)
Pt does not wait to be seen. Pt said he will wait to see his results on his MyChart.

## 2020-08-11 ENCOUNTER — Telehealth: Payer: Self-pay

## 2020-08-11 NOTE — Telephone Encounter (Signed)
Transition Care Management Follow-up Telephone Call  Date of discharge and from where: 08/10/2020 from [facility].   How have you been since you were released from the hospital? Patient was exposed to someone with COVID and wanted piece of mind by getting tested.   Any questions or concerns? No  Patient was not seen by an ED provider.

## 2020-08-19 ENCOUNTER — Other Ambulatory Visit: Payer: Self-pay

## 2020-08-19 ENCOUNTER — Telehealth: Payer: Self-pay | Admitting: Internal Medicine

## 2020-08-19 DIAGNOSIS — E559 Vitamin D deficiency, unspecified: Secondary | ICD-10-CM

## 2020-08-19 DIAGNOSIS — F909 Attention-deficit hyperactivity disorder, unspecified type: Secondary | ICD-10-CM

## 2020-08-19 MED ORDER — VITAMIN D (ERGOCALCIFEROL) 1.25 MG (50000 UNIT) PO CAPS: 50000.0000 [IU] | ORAL_CAPSULE | ORAL | 1 refills | Status: DC

## 2020-08-19 MED ORDER — AMPHETAMINE-DEXTROAMPHETAMINE 10 MG PO TABS: ORAL_TABLET | ORAL | 0 refills | Status: DC

## 2020-08-19 MED ORDER — ERGOCALCIFEROL 10 MCG (400 UNIT) PO TABS: 2.0000 | ORAL_TABLET | Freq: Every day | ORAL | 2 refills | Status: DC

## 2020-08-19 MED ORDER — AMPHETAMINE-DEXTROAMPHET ER 30 MG PO CP24: 30.0000 mg | ORAL_CAPSULE | Freq: Every day | ORAL | 0 refills | Status: DC

## 2020-08-19 NOTE — Addendum Note (Signed)
Addended by: Guinevere Scarlet A on: 08/19/2020 01:19 PM   Modules accepted: Orders

## 2020-08-19 NOTE — Telephone Encounter (Signed)
Please schedule pt a lab appt to check Vit D level per Dr Cleaster Corin. Thanks

## 2020-08-19 NOTE — Telephone Encounter (Signed)
Patient sent message via mychart with appointment date and time for labs.

## 2020-09-10 DIAGNOSIS — Z20828 Contact with and (suspected) exposure to other viral communicable diseases: Secondary | ICD-10-CM | POA: Diagnosis not present

## 2020-09-16 ENCOUNTER — Other Ambulatory Visit: Payer: Medicaid Other

## 2020-09-18 ENCOUNTER — Other Ambulatory Visit: Payer: Self-pay | Admitting: Family

## 2020-09-18 DIAGNOSIS — F909 Attention-deficit hyperactivity disorder, unspecified type: Secondary | ICD-10-CM

## 2020-09-19 MED ORDER — AMPHETAMINE-DEXTROAMPHET ER 30 MG PO CP24
30.0000 mg | ORAL_CAPSULE | Freq: Every day | ORAL | 0 refills | Status: DC
Start: 2020-09-19 — End: 2020-10-19

## 2020-09-19 MED ORDER — AMPHETAMINE-DEXTROAMPHETAMINE 10 MG PO TABS: ORAL_TABLET | ORAL | 0 refills | Status: DC

## 2020-10-19 ENCOUNTER — Other Ambulatory Visit: Payer: Self-pay | Admitting: Family

## 2020-10-19 DIAGNOSIS — F909 Attention-deficit hyperactivity disorder, unspecified type: Secondary | ICD-10-CM

## 2020-10-19 MED ORDER — AMPHETAMINE-DEXTROAMPHETAMINE 10 MG PO TABS: ORAL_TABLET | ORAL | 0 refills | Status: DC

## 2020-10-19 MED ORDER — AMPHETAMINE-DEXTROAMPHET ER 30 MG PO CP24: 30.0000 mg | ORAL_CAPSULE | Freq: Every day | ORAL | 0 refills | Status: DC

## 2020-11-19 ENCOUNTER — Other Ambulatory Visit: Payer: Self-pay

## 2020-11-19 DIAGNOSIS — F909 Attention-deficit hyperactivity disorder, unspecified type: Secondary | ICD-10-CM

## 2020-11-20 ENCOUNTER — Other Ambulatory Visit: Payer: Self-pay

## 2020-11-20 DIAGNOSIS — F909 Attention-deficit hyperactivity disorder, unspecified type: Secondary | ICD-10-CM

## 2020-11-20 MED ORDER — AMPHETAMINE-DEXTROAMPHETAMINE 10 MG PO TABS: ORAL_TABLET | ORAL | 0 refills | Status: DC

## 2020-11-20 MED ORDER — AMPHETAMINE-DEXTROAMPHET ER 30 MG PO CP24
30.0000 mg | ORAL_CAPSULE | Freq: Every day | ORAL | 0 refills | Status: DC
Start: 2020-11-20 — End: 2021-02-25

## 2020-12-22 ENCOUNTER — Other Ambulatory Visit: Payer: Self-pay | Admitting: Family

## 2020-12-22 DIAGNOSIS — F909 Attention-deficit hyperactivity disorder, unspecified type: Secondary | ICD-10-CM

## 2020-12-27 ENCOUNTER — Other Ambulatory Visit: Payer: Self-pay | Admitting: Family

## 2020-12-27 DIAGNOSIS — F909 Attention-deficit hyperactivity disorder, unspecified type: Secondary | ICD-10-CM

## 2021-01-08 ENCOUNTER — Other Ambulatory Visit: Payer: Self-pay | Admitting: Family

## 2021-01-08 DIAGNOSIS — F909 Attention-deficit hyperactivity disorder, unspecified type: Secondary | ICD-10-CM

## 2021-02-08 ENCOUNTER — Other Ambulatory Visit: Payer: Self-pay | Admitting: Internal Medicine

## 2021-02-08 DIAGNOSIS — E559 Vitamin D deficiency, unspecified: Secondary | ICD-10-CM

## 2021-02-09 ENCOUNTER — Other Ambulatory Visit: Payer: Medicaid Other

## 2021-02-23 ENCOUNTER — Encounter: Payer: Self-pay | Admitting: *Deleted

## 2021-02-23 NOTE — Patient Instructions (Addendum)
It was wonderful to meet you today.  Please bring ALL of your medications with you to every visit.   Today we talked about:  Today at your annual preventive visit we talked about the following measures:   I recommend 150 minutes of exercise per week-try 30 minutes 5 days per week We discussed reducing sugary beverages (like soda and juice) and increasing leafy greens and whole fruits.  We discussed avoiding tobacco and alcohol.  I recommend avoiding illicit substances.  Your blood pressure is 124/70 at goal of <130/80.    Thank you for choosing [facility].   Please call [phone] with any questions about today's appointment.  Please be sure to schedule follow up at the front  desk before you leave today.   Sabino Dick, DO PGY-1 Family Medicine

## 2021-02-23 NOTE — Progress Notes (Addendum)
   Subjective:    Patient ID: [name], male    DOB: [birth year], 25 y.o.   MRN: [id number]   CC: Establish care  HPI:  [name] is a very pleasant 25 y.o. male who presents today to establish care.  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 Used to smoke black and milds, stopped about 1 year ago. Smoked for total of 2-3 years, 2/day. No alcohol. Used to exercise, not anymore since pandemic started. Diet-"I eat everything". Lots of fast food.  Drinks soda, about 3 regular soda's a week.   ROS: pertinent noted in the HPI   Objective:  BP 124/70   Pulse 74   Ht 5\' 11"  (1.803 m)   Wt (!) 332 lb (150.6 kg)   SpO2 98%   BMI 46.30 kg/m   Vitals and nursing note reviewed  General: Awake, alert, oriented, obese, in no acute distress, pleasant and cooperative with examination HEENT: Normocephalic, atraumatic, nares patent, dentition is good, oropharynx without erythema or exudates, TM's clear bilaterally, no thyroid nodules palpated Cardio: RRR without murmur, 2+ radial, DP and PT pulses b/l Respiratory: CTAB without wheezing/rhonchi/rales Abdomen: Obese, soft, non-tender to palpation of all quadrants, non-distended, no rebound/guarding, no organomegaly MSK: Able to move all extremities spontaneously, good muscle strength, no abnormalities Extremities: without edema or cyanosis Neuro: Speech is clear and intact, no focal deficits, no facial asymmetry, follows commands  Psych: Normal mood and affect  Assessment & Plan:    Encounter to  establish care with new doctor Patient presents today to establish care.  Has previously been diagnosed with essential hypertension though is not on any medications and appears well controlled.  We discussed diet and exercise recommendations given his morbid obesity and prediabetes. Will defer any screening blood work today given age and he is also asymptomatic and without concerns. Declined COVID vaccination, he will think about it further and discuss with his partner first. Declines STI screening today.  ADHD (attention deficit hyperactivity disorder) Chronic medication. Currently on Adderall XR 30 mg with breakfast and Adderall 10 mg at 2PM. No longer in school and works as a . Can consider wean off in the future. Keep as is for now. No refills placed today.  Essential hypertension Previously diagnosed but normal today and at last IM clinic visit last year. Not controlled on any medications. We discussed exercise and diet recommendations. He has previously been referred to dietician last year. Discussed working towards weight-loss and that this is a long-term goal.    Administrator, DO Family Medicine Resident

## 2021-02-24 ENCOUNTER — Ambulatory Visit (INDEPENDENT_AMBULATORY_CARE_PROVIDER_SITE_OTHER): Payer: Medicaid Other | Admitting: Family Medicine

## 2021-02-24 ENCOUNTER — Encounter: Payer: Self-pay | Admitting: Family Medicine

## 2021-02-24 ENCOUNTER — Other Ambulatory Visit: Payer: Self-pay

## 2021-02-24 DIAGNOSIS — Z7689 Persons encountering health services in other specified circumstances: Secondary | ICD-10-CM | POA: Diagnosis not present

## 2021-02-24 DIAGNOSIS — F909 Attention-deficit hyperactivity disorder, unspecified type: Secondary | ICD-10-CM | POA: Diagnosis not present

## 2021-02-24 DIAGNOSIS — I10 Essential (primary) hypertension: Secondary | ICD-10-CM

## 2021-02-24 NOTE — Assessment & Plan Note (Signed)
Previously diagnosed but normal today and at last IM clinic visit last year. Not controlled on any medications. We discussed exercise and diet recommendations. He has previously been referred to dietician last year. Discussed working towards weight-loss and that this is a long-term goal.

## 2021-02-24 NOTE — Assessment & Plan Note (Addendum)
Chronic medication. Currently on Adderall XR 30 mg with breakfast and Adderall 10 mg at 2PM. 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 Can consider wean off in the future. Keep as is for now. No refills placed today.

## 2021-02-24 NOTE — Assessment & Plan Note (Signed)
Patient presents today to establish care.  Has previously been diagnosed with essential hypertension though is not on any medications and appears well controlled.  We discussed diet and exercise recommendations given his morbid obesity and prediabetes. Will defer any screening blood work today given age and he is also asymptomatic and without concerns. Declined COVID vaccination, he will think about it further and discuss with his partner first. Declines STI screening today.

## 2021-02-25 ENCOUNTER — Other Ambulatory Visit: Payer: Self-pay | Admitting: Family

## 2021-02-25 DIAGNOSIS — E559 Vitamin D deficiency, unspecified: Secondary | ICD-10-CM

## 2021-02-25 DIAGNOSIS — F909 Attention-deficit hyperactivity disorder, unspecified type: Secondary | ICD-10-CM

## 2021-02-26 ENCOUNTER — Encounter: Payer: Self-pay | Admitting: Family Medicine

## 2021-02-26 ENCOUNTER — Other Ambulatory Visit: Payer: Self-pay | Admitting: Family Medicine

## 2021-02-26 MED ORDER — ALBUTEROL SULFATE HFA 108 (90 BASE) MCG/ACT IN AERS: 2.0000 | INHALATION_SPRAY | Freq: Four times a day (QID) | RESPIRATORY_TRACT | 0 refills | Status: DC | PRN

## 2021-02-26 MED ORDER — AMPHETAMINE-DEXTROAMPHET ER 30 MG PO CP24
30.0000 mg | ORAL_CAPSULE | Freq: Every day | ORAL | 0 refills | Status: DC
Start: 2021-02-26 — End: 2021-04-16

## 2021-02-26 MED ORDER — AMPHETAMINE-DEXTROAMPHETAMINE 10 MG PO TABS: ORAL_TABLET | ORAL | 0 refills | Status: DC

## 2021-02-26 MED ORDER — VITAMIN D (ERGOCALCIFEROL) 1.25 MG (50000 UNIT) PO CAPS: 50000.0000 [IU] | ORAL_CAPSULE | ORAL | 1 refills | Status: DC

## 2021-03-06 ENCOUNTER — Encounter (HOSPITAL_COMMUNITY): Payer: Self-pay | Admitting: Emergency Medicine

## 2021-03-06 ENCOUNTER — Ambulatory Visit (HOSPITAL_COMMUNITY)
Admission: EM | Admit: 2021-03-06 | Discharge: 2021-03-06 | Discharge status: Against Medical Advice | Payer: Medicaid Other

## 2021-03-06 ENCOUNTER — Other Ambulatory Visit: Payer: Self-pay

## 2021-03-06 ENCOUNTER — Emergency Department (HOSPITAL_COMMUNITY)
Admission: EM | Admit: 2021-03-06 | Discharge: 2021-03-06 | Disposition: A | Discharge status: Home / Self Care | Payer: Medicaid Other | Attending: Emergency Medicine | Admitting: Emergency Medicine

## 2021-03-06 DIAGNOSIS — I10 Essential (primary) hypertension: Secondary | ICD-10-CM | POA: Diagnosis not present

## 2021-03-06 DIAGNOSIS — M79662 Pain in left lower leg: Secondary | ICD-10-CM | POA: Insufficient documentation

## 2021-03-06 DIAGNOSIS — R262 Difficulty in walking, not elsewhere classified: Secondary | ICD-10-CM | POA: Insufficient documentation

## 2021-03-06 DIAGNOSIS — R519 Headache, unspecified: Secondary | ICD-10-CM | POA: Diagnosis not present

## 2021-03-06 DIAGNOSIS — H538 Other visual disturbances: Secondary | ICD-10-CM | POA: Diagnosis not present

## 2021-03-06 DIAGNOSIS — R6883 Chills (without fever): Secondary | ICD-10-CM | POA: Diagnosis not present

## 2021-03-06 DIAGNOSIS — J029 Acute pharyngitis, unspecified: Secondary | ICD-10-CM | POA: Insufficient documentation

## 2021-03-06 DIAGNOSIS — M79661 Pain in right lower leg: Secondary | ICD-10-CM | POA: Insufficient documentation

## 2021-03-06 DIAGNOSIS — F17290 Nicotine dependence, other tobacco product, uncomplicated: Secondary | ICD-10-CM | POA: Insufficient documentation

## 2021-03-06 DIAGNOSIS — Z79899 Other long term (current) drug therapy: Secondary | ICD-10-CM | POA: Insufficient documentation

## 2021-03-06 DIAGNOSIS — J45909 Unspecified asthma, uncomplicated: Secondary | ICD-10-CM | POA: Diagnosis not present

## 2021-03-06 LAB — CBC WITH DIFFERENTIAL/PLATELET
Abs Immature Granulocytes: 0.06 10*3/uL (ref 0.00–0.07)
Basophils Absolute: 0 10*3/uL (ref 0.0–0.1)
Basophils Relative: 0 %
Eosinophils Absolute: 0.1 10*3/uL (ref 0.0–0.5)
Eosinophils Relative: 2 %
HCT: 40.1 % (ref 39.0–52.0)
Hemoglobin: 13.2 g/dL (ref 13.0–17.0)
Immature Granulocytes: 1 %
Lymphocytes Relative: 22 %
Lymphs Abs: 1.9 10*3/uL (ref 0.7–4.0)
MCH: 29.8 pg (ref 26.0–34.0)
MCHC: 32.9 g/dL (ref 30.0–36.0)
MCV: 90.5 fL (ref 80.0–100.0)
Monocytes Absolute: 1 10*3/uL (ref 0.1–1.0)
Monocytes Relative: 12 %
Neutro Abs: 5.7 10*3/uL (ref 1.7–7.7)
Neutrophils Relative %: 63 %
Platelets: 256 10*3/uL (ref 150–400)
RBC: 4.43 MIL/uL (ref 4.22–5.81)
RDW: 13.2 % (ref 11.5–15.5)
WBC: 8.8 10*3/uL (ref 4.0–10.5)
nRBC: 0 % (ref 0.0–0.2)

## 2021-03-06 LAB — BASIC METABOLIC PANEL
Anion gap: 9 (ref 5–15)
BUN: 6 mg/dL (ref 6–20)
CO2: 24 mmol/L (ref 22–32)
Calcium: 8.8 mg/dL — ABNORMAL LOW (ref 8.9–10.3)
Chloride: 103 mmol/L (ref 98–111)
Creatinine, Ser: 0.89 mg/dL (ref 0.61–1.24)
GFR, Estimated: >60 mL/min (ref >60)
Glucose, Bld: 109 mg/dL — ABNORMAL HIGH (ref 70–99)
Potassium: 3.5 mmol/L (ref 3.5–5.1)
Sodium: 136 mmol/L (ref 135–145)

## 2021-03-06 MED ORDER — LACTATED RINGERS IV BOLUS
1000.0000 mL | Freq: Once | INTRAVENOUS | Status: AC
  Administered 2021-03-06: 1000 mL via INTRAVENOUS

## 2021-03-06 MED ORDER — KETOROLAC TROMETHAMINE 15 MG/ML IJ SOLN
15.0000 mg | Freq: Once | INTRAMUSCULAR | Status: AC
  Administered 2021-03-06: 15 mg via INTRAVENOUS
  Filled 2021-03-06: qty 1

## 2021-03-06 MED ORDER — FLUORESCEIN SODIUM 1 MG OP STRP
1.0000 | ORAL_STRIP | Freq: Once | OPHTHALMIC | Status: AC
  Administered 2021-03-06: 1 via OPHTHALMIC
  Filled 2021-03-06: qty 1

## 2021-03-06 MED ORDER — ACETAMINOPHEN 500 MG PO TABS
1000.0000 mg | ORAL_TABLET | Freq: Once | ORAL | Status: AC
  Administered 2021-03-06: 1000 mg via ORAL
  Filled 2021-03-06: qty 2

## 2021-03-06 MED ORDER — PROCHLORPERAZINE EDISYLATE 10 MG/2ML IJ SOLN
10.0000 mg | Freq: Once | INTRAMUSCULAR | Status: AC
  Administered 2021-03-06: 10 mg via INTRAVENOUS
  Filled 2021-03-06: qty 2

## 2021-03-06 MED ORDER — DIPHENHYDRAMINE HCL 50 MG/ML IJ SOLN
25.0000 mg | Freq: Once | INTRAMUSCULAR | Status: AC
  Administered 2021-03-06: 25 mg via INTRAVENOUS
  Filled 2021-03-06: qty 1

## 2021-03-06 MED ORDER — TETRACAINE HCL 0.5 % OP SOLN
2.0000 [drp] | Freq: Once | OPHTHALMIC | Status: AC
  Administered 2021-03-06: 2 [drp] via OPHTHALMIC
  Filled 2021-03-06: qty 4

## 2021-03-06 NOTE — ED Provider Notes (Signed)
[facility] EMERGENCY DEPARTMENT Provider Note   CSN: [id number] Arrival date & time: 03/06/21  1239     History Chief Complaint  Patient presents with   Leg Pain   Migraine    [name] is a 25 y.o. male.   Leg Pain Migraine  24yoM pmhx adhd, asthma, p/w ha x2w. Headaches are 10/10 severity, bitemporal but nonradiating, sharp/stabbing quality, worsened by light and loud sounds, no significant improving factors. Episodes last 3d continuously, ceases for 0.5-1d, then recurs. Associated sx include pain, redness, blurriness, and tearing in left eye, chills x5d last week, sore throat.  Additionally notes bilateral lower extremity pains and difficulty with walking.  No relief w/ advil, bc powder. No prior hx of similar.  No known concerning insect/zoonotic exposures.  No further medical fevers, diaphoresis, cough, wheezing, sneezing, cp, palpitations, sob, nv, abdominal pain, bowel/bladder changes, syncope, seizure, new rash.  History obtained from patient, significant other, chart review.    Past Medical History:  Diagnosis Date   ADHD (attention deficit hyperactivity disorder)    Asthma    prn inhaler   Lateral meniscus tear 02/2017   right    Patient Active Problem List   Diagnosis Date Noted   Encounter to establish care with new doctor 02/24/2021   Headache 12/23/2019   Body mass index (BMI) 40.0-44.9, adult 07/02/2018   Mixed hyperlipidemia 07/02/2018   Essential hypertension 07/02/2018   Failed vision screen 03/14/2018   Chronic pain of right knee 11/22/2016   Acute lateral meniscus tear of right knee 05/22/2016   Acanthosis nigricans 07/09/2014   Prediabetes 07/09/2014   Allergic rhinitis 12/29/2013   Adjustment disorder with anxiety 12/10/2013   Vitamin D deficiency 07/12/2013   ADHD (attention deficit hyperactivity disorder) 05/15/2013   Obesity 05/15/2013    Past Surgical History:  Procedure Laterality Date   HAND SURGERY Left     as a child   KNEE ARTHROSCOPY WITH LATERAL MENISECTOMY Right 03/22/2017   Procedure: RIGHT KNEE ARTHROSCOPY WITH LATERAL MENISCECTOMY;  Surgeon: Salvatore Marvel, MD;  Location: [facility];  Service: Orthopedics;  Laterality: Right;       Family History  Problem Relation Age of Onset   Obesity Mother    Sleep apnea Mother    Diabetes Mother    Hypertension Mother    COPD Other    Hyperlipidemia Other    Hypertension Other    Diabetes Maternal Grandmother    Hypertension Maternal Grandmother    Diabetes Paternal Grandfather     Social History   Tobacco Use   Smoking status: Light Smoker    Types: Cigars   Smokeless tobacco: Never   Tobacco comments:    1 per 2 days  Vaping Use   Vaping Use: Never used  Substance Use Topics   Alcohol use: No    Alcohol/week: 0.0 standard drinks   Drug use: No    Home Medications Prior to Admission medications   Medication Sig Start Date End Date Taking? Authorizing Provider  albuterol (VENTOLIN HFA) 108 (90 Base) MCG/ACT inhaler Inhale 2 puffs into the lungs every 6 (six) hours as needed for wheezing or shortness of breath. 02/26/21   Sabino Dick, [name]  amphetamine-dextroamphetamine (ADDERALL XR) 30 MG 24 hr capsule Take 1 capsule (30 mg total) by mouth daily with breakfast. 02/26/21   Sabino Dick, [name]  amphetamine-dextroamphetamine (ADDERALL) 10 MG tablet Take 1 tablet (10 mg) daily by mouth at 2pm. 02/26/21   Sabino Dick, [name]  Vitamin D, Ergocalciferol, (DRISDOL) 1.25 MG (50000 UNIT) CAPS capsule Take 1 capsule (50,000 Units total) by mouth every 7 (seven) days. 02/26/21   Sabino DickEspinoza, Alejandra, [name]  cloNIDine HCl (KAPVAY) 0.1 MG TB12 ER tablet Take 1 tablet (0.1 mg total) by mouth at bedtime. 01/07/16 03/14/18  Verneda SkillHacker, Caroline T, FNP  fluticasone (FLONASE) 50 MCG/ACT nasal spray Place 2 sprays into both nostrils daily. 02/20/15 01/27/16  Elam CitySibrack, Elizabeth, MD  loratadine (CLARITIN) 10 MG tablet Take 1 tablet (10 mg  total) by mouth daily. 11/10/14 01/27/16  Verneda SkillHacker, Caroline T, FNP  pantoprazole (PROTONIX) 40 MG tablet Take 1 tablet (40 mg total) by mouth daily. 10/05/15 01/27/16  Verneda SkillHacker, Caroline T, FNP    Allergies    Patient has no known allergies.  Review of Systems   Review of Systems  All other systems reviewed and are negative.  Physical Exam Updated Vital Signs BP 128/76   Pulse 88   Temp 99.5 F (37.5 C)   Resp 18   SpO2 100%   Physical Exam Vitals and nursing note reviewed.  Constitutional:      General: He is not in acute distress.    Appearance: He is not toxic-appearing.  HENT:     Head: Normocephalic and atraumatic.     Right Ear: External ear normal.     Left Ear: External ear normal.     Nose: Nose normal.     Mouth/Throat:     Mouth: Mucous membranes are moist.     Pharynx: Oropharynx is clear.  Eyes:     Extraocular Movements: Extraocular movements intact.     Pupils: Pupils are equal, round, and reactive to light.     Comments: OS: Pain with accommodation lateral gaze, erythematous conjunctiva with some tearing, IOP 8, negative Wood lamp assessment, no discharge OD: Mildly erythematous conjunctiva, no discharge  Cardiovascular:     Rate and Rhythm: Normal rate and regular rhythm.     Heart sounds: No murmur heard.   No friction rub. No gallop.  Pulmonary:     Effort: Pulmonary effort is normal.     Breath sounds: No stridor. No wheezing, rhonchi or rales.  Abdominal:     General: There is no distension.     Palpations: Abdomen is soft.     Tenderness: There is no abdominal tenderness. There is no guarding or rebound.  Musculoskeletal:     Cervical back: Neck supple. No rigidity or tenderness.     Right lower leg: No edema.     Left lower leg: No edema.     Comments: Tenderness over bilateral shins and ankles.  No swelling, ecchymosis, effusion, deformity, crepitus appreciated.  2+ distal pulses and sensation intact.  Skin:    General: Skin is warm and dry.   Neurological:     Mental Status: He is oriented to person, place, and time. Mental status is at baseline.     Comments: Mental status: a&o x4 Speech: clear, no dysarthria CN II: visual fields grossly intact CN III/IV/VI: PERRL, EOMI CN V: facial sensation to LT and mastication intact CN VII: no facial droop CN VIII: no nystagmus, audition intact to finger rub VN IX/X: swallow intact CN XI: trapezius and SCM motor function intact CN XII: midline tongue w/o atrophy or fasciculation RUE: 5/5 strength, sensation to LT intact LUE: 5/5 strength, sensation to LT intact RLE: 5/5 strength, sensation to LT intact LLE: 5/5 strength, sensation to LT intact Coordination: finger-to-nose, foot alternation intact. No pronator drift or  dysdiadochokinesia Gait: Untested     ED Results / Procedures / Treatments   Labs (all labs ordered are listed, but only abnormal results are displayed) Labs Reviewed  BASIC METABOLIC PANEL - Abnormal; Notable for the following components:      Result Value   Glucose, Bld 109 (*)    Calcium 8.8 (*)    All other components within normal limits  CBC WITH DIFFERENTIAL/PLATELET    EKG None  Radiology No results found.  Procedures Procedures   Medications Ordered in ED Medications  lactated ringers bolus 1,000 mL (0 mLs Intravenous Stopped 03/06/21 1824)  acetaminophen (TYLENOL) tablet 1,000 mg (1,000 mg Oral Given 03/06/21 1633)  prochlorperazine (COMPAZINE) injection 10 mg (10 mg Intravenous Given 03/06/21 1639)  diphenhydrAMINE (BENADRYL) injection 25 mg (25 mg Intravenous Given 03/06/21 1634)  ketorolac (TORADOL) 15 MG/ML injection 15 mg (15 mg Intravenous Given 03/06/21 1636)  tetracaine (PONTOCAINE) 0.5 % ophthalmic solution 2 drop (2 drops Left Eye Given 03/06/21 1642)  fluorescein ophthalmic strip 1 strip (1 strip Left Eye Given 03/06/21 1642)    ED Course  I have reviewed the triage vital signs and the nursing notes.  Pertinent labs & imaging  results that were available during my care of the patient were reviewed by me and considered in my medical decision making (see chart for details).    MDM Rules/Calculators/A&P                          This is a 26 year old male with no relevant PMHx presenting for severe bitemporal headaches over the last 2 weeks lasting days at a time, left eye pain, redness, and tearing, and recent bilateral lower extremity pains with some associated difficulty with ambulation.  Neurologic exam completely benign.  OS IOP 8, negative Wood lamp exam, no foreign body appreciated.  Bilateral lower extremities TTP over shins and ankle without any evidence of trauma.  Initial interventions: Migraine cocktail provided with relief of symptoms  DDx included: Ischemic stroke, TIA, ICH, space-occupying lesion, watershed infarct, seizure, meningitis, encephalitis, metabolic derangement, exogenous tox, migraine, tension headache, cluster headache, malignancy  All studies independently reviewed by myself, d/w the attending physician, factored into my MDM. -Unremarkable: CBCd, BMP  Presentation appears most consistent with migraine headache, although possibly with associate cluster headache though the episodes last longer than would be expected of the latter.  Reassuring PMHx, and unlikely demographic for ischemia or ICH, not displaying any focal neurodeficits on exam.  Inconsistent with seizure, no convulsive activity or staring spells, nor any postictal state noted on history.  No neck stiffness, photophobia, or other meningeal signs.  Reassuring lab work.  No known concerning exposures.  Presentation less consistent with malignancy, reassuring CBC in the context of bilateral LE arthralgias.  Therefore, feel that patient is safe for discharge home with close outpatient follow-up.  Return precautions discussed.  He understands and agrees with the plan.  Patient HDS, nontoxic, ambulatory on reevaluation, subsequently  discharged.  Final Clinical Impression(s) / ED Diagnoses Final diagnoses:  Nonintractable headache, unspecified chronicity pattern, unspecified headache type    Rx / DC Orders ED Discharge Orders          Ordered    Ambulatory referral to Neurology       Comments: An appointment is requested in approximately: 1 week   03/06/21 Toma Copier, MD 03/07/21 [id number]    Adela Lank,  David Oka, [name] 03/07/21 1523

## 2021-03-06 NOTE — ED Notes (Signed)
Headache is now gone

## 2021-03-06 NOTE — ED Notes (Signed)
The pt has had a headache for 2-3 weeks he has not taken anything for the headache today he has a doctor which he has not seen  He is a daibetic  Pre he says diagnosed over a year ago

## 2021-03-06 NOTE — ED Triage Notes (Signed)
Patient from home, complaint of ongoing headaches for 2 weeks, complaint of light sensitivity with the headache and leg pain.

## 2021-03-06 NOTE — Discharge Instructions (Signed)
Your evaluated for your headache.  We believe that this is likely a migraine headache.  We did an examination of your eye that was reassuring.  Additionally, we performed some labs do not suggest an emergent cause of your leg pain.  Please return to the ER for any worsening symptoms.  Please follow-up with neurology clinic for further evaluation.  They should reach out to you to schedule appointment soon.

## 2021-03-06 NOTE — ED Notes (Signed)
[name], patient access reports patient/family chose to take patient to ED due to wait time

## 2021-03-08 ENCOUNTER — Telehealth: Payer: Self-pay

## 2021-03-08 NOTE — Telephone Encounter (Signed)
Transition Care Management Follow-up Telephone Call Date of discharge and from where: 03/06/2021-[facility] ED How have you been since you were released from the hospital? Feeling a little better.  Any questions or concerns? No  Items Reviewed: Did the pt receive and understand the discharge instructions provided? Yes  Medications obtained and verified?  No medication at discharge.  Other? No  Any new allergies since your discharge? No  Dietary orders reviewed? N/aA Do you have support at home? Yes   Home Care and Equipment/Supplies: Were home health services ordered? not applicable If so, what is the name of the agency? N/A  Has the agency set up a time to come to the patient's home? not applicable Were any new equipment or medical supplies ordered?  No What is the name of the medical supply agency? N/A Were you able to get the supplies/equipment? not applicable Do you have any questions related to the use of the equipment or supplies? No  Functional Questionnaire: (I = Independent and D = Dependent) ADLs: I  Bathing/Dressing- I  Meal Prep- I  Eating- I  Maintaining continence- I  Transferring/Ambulation- I  Managing Meds- I  Follow up appointments reviewed:  PCP Hospital f/u appt confirmed? No   Specialist Hospital f/u appt confirmed? No   Are transportation arrangements needed? No  If their condition worsens, is the pt aware to call PCP or go to the Emergency Dept.? Yes Was the patient provided with contact information for the PCP's office or ED? Yes Was to pt encouraged to call back with questions or concerns? Yes

## 2021-03-09 NOTE — Patient Instructions (Addendum)
Thank you for coming to see me today. It was a pleasure.   Neurology referral sent, if you do not hear anything this week please let us know  Please go to [facility] located at [address] tomorrow morning at 8 am.  Let them know that your Dr spoke with Dr Dione Booze and he advised to come to clinic.  Use preserve free tears eye drops as needed Stop Visine  Please follow-up with PCP in 2 weeks  If you have any questions or concerns, please do not hesitate to call the office at [phone].  Best,   Dana Allan, MD      Optometrists who accept Medicaid   Accepts Medicaid for Eye Exam and Glasses   [facility] [address] Phone: [phone]  Open Monday- Saturday from 9 AM to 5 PM Ages 6 months and older Se habla Espaol [facility] at [facility] [address] [place] Phone: [phone] Open Monday -Friday (by appointment only) Ages 39 and older No se habla Espaol   [facility] at [facility] [address] [place], [address] Phone: [phone] Open Monday-Saturday Ages 8 years and older Se habla Espaol  [facility] [place] [phone] [place], [place]  Phone: [phone] Open Monday-Friday Ages 5 years and older  Se habla Espaol   [facility] [place] [address] Phone: [phone] Open Monday-Friday Ages 5 and older No se habla Espaol  [facility] [place] [phone] Phone: [phone] Age 110 year old and older Open Monday-Saturday Se habla Espaol  [facility] [address] Phone: [phone] Open Monday-Friday Ages 7 and older No se habla Espaol         Accepts Medicaid for Eye Exam only (will have to pay for glasses)  [facility] [place] [address] Phone: [phone] Open 7 days per week Ages 5 and older (must know alphabet) No se habla Espaol  [facility] -  [address] [facility]  Phone: [phone] Open 7 days per week Ages 79 and older (must know alphabet) No se habla [facility] [address] [place], [address] Phone: [phone] Open Monday-Saturday Ages 6 years and older Se habla Espaol  [facility] [address] [place] Phone: [phone] Open 7 days per week Ages 5 and older (must know alphabet) No se habla Espaol

## 2021-03-09 NOTE — Progress Notes (Signed)
    SUBJECTIVE:   CHIEF COMPLAINT / HPI: follow up from ED for headaches  Recently seen in ED for 2 week headache relieved with migraine cocktail.  Was noted to have bilateral eye redness, occular movement pain, tearing and photophobia at that time.  Fluorescence staining negative for ulcers or abrasions.  IOP 8 at that time.  Did not meet imaging requirement.  He was referred to Neurology and is waiting for an appointment.  Since then patient reports that the headaches have improved.  He has been taking Headache relief (combo of Acetaminophen/Aspirin/Caffeine) which has been helping.  Continues to have photophobia without nausea and blurry vision without tearing. He has been using some type of eyedrops in the morning that he reports helps the blurry visions for a little while.  Denies any fevers, weakness, slurred speech, joint pain. Headache is worse in the morning.  Not sleeping much at night.  Gets about 4 hours of sleep.  Reports daytime fatigue.  Snores at night and reports that girlfriend has commented that he ' stops breathing ' at night for brief periods.   No history of headaches as a child or family history of migraines.  PERTINENT  PMH / PSH:  Class 3 Obesity  OBJECTIVE:   BP 118/74   Pulse 97   Ht 5\' 11"  (1.803 m)   Wt (!) 332 lb 6.4 oz (150.8 kg)   SpO2 99%   BMI 46.36 kg/m    General: Alert, no acute distress HEENT: diffuse scleral redness, no mucopurulent drainage noted  Cardio: Normal S1 and S2, RRR, no r/m/g Pulm: CTAB, normal work of breathing Neuro: CN II: PERRL CN III, IV,VI: EOMI with some pain on lateral movement,  CV V: Normal sensation in V1, V2, V3 CVII: Symmetric smile and brow raise CN VIII: Normal hearing CN IX,X: Symmetric palate raise  CN XI: 5/5 shoulder shrug CN XII: Symmetric tongue protrusion  UE and LE strength 5/5 2+ UE and LE reflexes  Normal sensation in UE and LE bilaterally  No ataxia with finger to nose, normal heel to shin  Negative  Rhomberg     ASSESSMENT/PLAN:   Headache Headache improving with Acetaminophen/Aspirin/Caffeine OTC.  Now less frequent and mostly occurs upon awakening.  Low suspicion for CVA, Malignancy given no focal neuro deficits on exam.  Multiple contributing factors contributing to headaches, lack of H2O intake, sleep hygiene and possible sleep apnea.  Discussed this with patient. -Headache journal -Continue OTC medication -Increase water intake -Sleep Hygiene -Refer for sleep studies,  STOP-BANG score 6, High Risk OSA -Follow up with PCP 1-2 weeks  Redness of both eyes Bilateral itching, blurry vision, scleral redness and pain on EOM.  Using eye drops in morning with some relief.  Denies any fevers, joint pain, eye discharge or decrease in vision.  Visual fields intact, PERRLA, EOM intact with some pain on lateral movement.  Likely irritation from recent fluorescence strips but concern for scleritis given occular pain and scleral redness.  Spoke with Ophthalmology, Dr , advised to stop any eye drops, no antibiotics given no mucopurulent drainage and to follow up tomorrow morning with him at [facility]. -Patient given instructions to go to to clinic tomorrow morning at 8 am. -Avoid any eye drops -Follow up with PCP as needed     HOSP [place]LLA [place], MD [facility] Center

## 2021-03-10 ENCOUNTER — Ambulatory Visit (INDEPENDENT_AMBULATORY_CARE_PROVIDER_SITE_OTHER): Payer: Medicaid Other | Admitting: Family Medicine

## 2021-03-10 ENCOUNTER — Encounter: Payer: Self-pay | Admitting: Family Medicine

## 2021-03-10 ENCOUNTER — Other Ambulatory Visit: Payer: Self-pay

## 2021-03-10 VITALS — BP 118/74 | HR 97 | Ht 71.0 in | Wt 332.4 lb

## 2021-03-10 DIAGNOSIS — Z6841 Body Mass Index (BMI) 40.0 and over, adult: Secondary | ICD-10-CM | POA: Diagnosis not present

## 2021-03-10 DIAGNOSIS — H5789 Other specified disorders of eye and adnexa: Secondary | ICD-10-CM | POA: Diagnosis not present

## 2021-03-10 DIAGNOSIS — R519 Headache, unspecified: Secondary | ICD-10-CM | POA: Diagnosis present

## 2021-03-11 ENCOUNTER — Encounter: Payer: Self-pay | Admitting: Family Medicine

## 2021-03-11 DIAGNOSIS — H2000 Unspecified acute and subacute iridocyclitis: Secondary | ICD-10-CM | POA: Diagnosis not present

## 2021-03-11 DIAGNOSIS — H5789 Other specified disorders of eye and adnexa: Secondary | ICD-10-CM | POA: Insufficient documentation

## 2021-03-11 NOTE — Assessment & Plan Note (Addendum)
Bilateral itching, blurry vision, scleral redness and pain on EOM.  Using eye drops in morning with some relief.  Denies any fevers, joint pain, eye discharge or decrease in vision.  Visual fields intact, PERRLA, EOM intact with some pain on lateral movement.  Likely irritation from recent fluorescence strips but concern for scleritis given occular pain and scleral redness.  Spoke with Ophthalmology, Dr Dione Booze, advised to stop any eye drops, no antibiotics given no mucopurulent drainage and to follow up tomorrow morning with him at [facility]. -Patient given instructions to go to to clinic tomorrow morning at 8 am. -Avoid any eye drops -Follow up with PCP as needed

## 2021-03-11 NOTE — Assessment & Plan Note (Addendum)
Headache improving with Acetaminophen/Aspirin/Caffeine OTC.  Now less frequent and mostly occurs upon awakening.  Low suspicion for CVA, Malignancy given no focal neuro deficits on exam.  Multiple contributing factors contributing to headaches, lack of H2O intake, sleep hygiene and possible sleep apnea.  Discussed this with patient. -Headache journal -Continue OTC medication -Increase water intake -Sleep Hygiene -Refer for sleep studies,  STOP-BANG score 6, High Risk OSA -Follow up with PCP 1-2 weeks

## 2021-03-16 DIAGNOSIS — H209 Unspecified iridocyclitis: Secondary | ICD-10-CM | POA: Diagnosis not present

## 2021-03-16 DIAGNOSIS — H21543 Posterior synechiae (iris), bilateral: Secondary | ICD-10-CM | POA: Diagnosis not present

## 2021-03-16 DIAGNOSIS — H3581 Retinal edema: Secondary | ICD-10-CM | POA: Diagnosis not present

## 2021-03-29 ENCOUNTER — Encounter: Payer: Self-pay | Admitting: Family Medicine

## 2021-03-30 ENCOUNTER — Other Ambulatory Visit: Payer: Self-pay | Admitting: Family Medicine

## 2021-03-30 ENCOUNTER — Encounter: Payer: Self-pay | Admitting: Family Medicine

## 2021-03-30 DIAGNOSIS — E559 Vitamin D deficiency, unspecified: Secondary | ICD-10-CM

## 2021-03-30 MED ORDER — VITAMIN D (ERGOCALCIFEROL) 1.25 MG (50000 UNIT) PO CAPS: 50000.0000 [IU] | ORAL_CAPSULE | ORAL | 1 refills | Status: DC

## 2021-03-30 NOTE — Progress Notes (Signed)
Refilled vitamin D. Last vitamin D level checked in 2021. Could follow up with repeat level at next visit.

## 2021-04-06 DIAGNOSIS — H209 Unspecified iridocyclitis: Secondary | ICD-10-CM | POA: Diagnosis not present

## 2021-04-06 DIAGNOSIS — H3581 Retinal edema: Secondary | ICD-10-CM | POA: Diagnosis not present

## 2021-04-06 DIAGNOSIS — H21543 Posterior synechiae (iris), bilateral: Secondary | ICD-10-CM | POA: Diagnosis not present

## 2021-04-15 ENCOUNTER — Telehealth (INDEPENDENT_AMBULATORY_CARE_PROVIDER_SITE_OTHER): Payer: Medicaid Other | Admitting: Family Medicine

## 2021-04-15 DIAGNOSIS — F909 Attention-deficit hyperactivity disorder, unspecified type: Secondary | ICD-10-CM | POA: Diagnosis not present

## 2021-04-15 DIAGNOSIS — J309 Allergic rhinitis, unspecified: Secondary | ICD-10-CM

## 2021-04-15 DIAGNOSIS — E559 Vitamin D deficiency, unspecified: Secondary | ICD-10-CM

## 2021-04-15 NOTE — Progress Notes (Addendum)
    SUBJECTIVE:   CHIEF COMPLAINT / HPI:   [name] is a 25 yo who presents for a virtual video visit to discuss his ADHD medication. Patient is located at his home, and I am located in the office. Patient's identity was verified. Patient requested virtual visit since he was unable to make it in person.   Current taking Adderall XR 30 mg at about 6am with breakfast, Adderall 10 mg at 5 PM. Has been taking this medication for ADHD since elementary. Goes to bed at reasonable time every day and endorses getting enough sleep. Appetite has been normal    Also taking Vitamin D supplementation, Flonase PRN, Protonix 40 mg as needed, albuterol about 4 times a week. Not on a daily controller medication   Need refill of Vit D, flonase, refill of asthma pump    OBJECTIVE:   There were no vitals taken for this visit.   General: well appearing over video, NAD Resp: normal WOB on RA  Derm: no visible rashes or lesions   ASSESSMENT/PLAN:   No problem-specific Assessment & Plan notes found for this encounter.   ADHD Stable on current regimen of Adderall 30mg  in am and 10mg  pm. Denies CP, palpitations, nausea. Endorses adequate sleep and appetite. Refilled medication.   Hx of Vitamin D deficiency  Vit D 11 in March of 2021. He has been on 50,000 units weekly. Refilled Vit D. Consider rechecking at next visit   Asthma  Endorses albuterol use about 4 times a week. Not on a controller inhaler. Discuss possible need for additional therapy at next visit.   Seasonal allergies  Stable on Flonase daily. Refilled  Follow up appt scheduled with PCP on 19th at 1:30   02-24-1982, DO [facility]

## 2021-04-16 ENCOUNTER — Other Ambulatory Visit: Payer: Self-pay

## 2021-04-16 DIAGNOSIS — F909 Attention-deficit hyperactivity disorder, unspecified type: Secondary | ICD-10-CM

## 2021-04-17 MED ORDER — AMPHETAMINE-DEXTROAMPHETAMINE 10 MG PO TABS: ORAL_TABLET | ORAL | 0 refills | Status: DC

## 2021-04-17 MED ORDER — AMPHETAMINE-DEXTROAMPHET ER 30 MG PO CP24: 30.0000 mg | ORAL_CAPSULE | Freq: Every day | ORAL | 0 refills | Status: DC

## 2021-04-17 MED ORDER — ALBUTEROL SULFATE HFA 108 (90 BASE) MCG/ACT IN AERS: 2.0000 | INHALATION_SPRAY | Freq: Four times a day (QID) | RESPIRATORY_TRACT | 0 refills | Status: DC | PRN

## 2021-04-17 MED ORDER — FLUTICASONE PROPIONATE 50 MCG/ACT NA SUSP: 2.0000 | Freq: Every day | NASAL | 12 refills | Status: DC

## 2021-04-17 MED ORDER — VITAMIN D (ERGOCALCIFEROL) 1.25 MG (50000 UNIT) PO CAPS: 50000.0000 [IU] | ORAL_CAPSULE | ORAL | 1 refills | Status: DC

## 2021-05-02 NOTE — Addendum Note (Signed)
Addended by: Cora Collum on: 05/02/2021 09:43 PM   Modules accepted: Level of Service

## 2021-05-09 NOTE — Progress Notes (Deleted)
    SUBJECTIVE:   CHIEF COMPLAINT / HPI:   ADHD Patient presents for follow-up on ADHD.  He currently takes 30 mg of Adderall XR and 10 mg of Adderall daily.   PERTINENT  PMH / PSH:  Past Medical History:  Diagnosis Date   ADHD (attention deficit hyperactivity disorder)    Asthma    prn inhaler   Lateral meniscus tear 02/2017   right     OBJECTIVE:   There were no vitals taken for this visit. ***  General: NAD, pleasant, able to participate in exam Cardiac: RRR, no murmurs. Respiratory: CTAB, normal effort, No wheezes, rales or rhonchi Abdomen: Bowel sounds present, nontender, nondistended, no hepatosplenomegaly. Extremities: no edema or cyanosis. Skin: warm and dry, no rashes noted Neuro: alert, no obvious focal deficits Psych: Normal affect and mood  ASSESSMENT/PLAN:   No problem-specific Assessment & Plan notes found for this encounter.     Sabino Dick, DO [facility] Center

## 2021-05-10 ENCOUNTER — Ambulatory Visit: Payer: Medicaid Other

## 2021-05-10 ENCOUNTER — Encounter: Payer: Self-pay | Admitting: Family Medicine

## 2021-05-10 ENCOUNTER — Ambulatory Visit: Payer: Medicaid Other | Admitting: Family Medicine

## 2021-05-10 ENCOUNTER — Telehealth: Payer: Self-pay | Admitting: Family Medicine

## 2021-05-10 DIAGNOSIS — F909 Attention-deficit hyperactivity disorder, unspecified type: Secondary | ICD-10-CM

## 2021-05-10 DIAGNOSIS — E559 Vitamin D deficiency, unspecified: Secondary | ICD-10-CM

## 2021-05-10 NOTE — Telephone Encounter (Signed)
Patient no showed for his appointment with me this afternoon.  Attempted to reach patient via telephone to discuss however there is no answer and voicemail had not been set up.

## 2021-05-19 MED ORDER — AMPHETAMINE-DEXTROAMPHETAMINE 10 MG PO TABS: ORAL_TABLET | ORAL | 0 refills | Status: DC

## 2021-05-19 MED ORDER — VITAMIN D (ERGOCALCIFEROL) 1.25 MG (50000 UNIT) PO CAPS: 50000.0000 [IU] | ORAL_CAPSULE | ORAL | 1 refills | Status: DC

## 2021-05-19 MED ORDER — AMPHETAMINE-DEXTROAMPHET ER 30 MG PO CP24: 30.0000 mg | ORAL_CAPSULE | Freq: Every day | ORAL | 0 refills | Status: DC

## 2021-06-16 ENCOUNTER — Ambulatory Visit (HOSPITAL_BASED_OUTPATIENT_CLINIC_OR_DEPARTMENT_OTHER): Payer: Medicaid Other | Attending: Family Medicine | Admitting: Internal Medicine

## 2021-06-21 ENCOUNTER — Encounter: Payer: Self-pay | Admitting: Family Medicine

## 2021-06-21 DIAGNOSIS — E559 Vitamin D deficiency, unspecified: Secondary | ICD-10-CM

## 2021-06-21 DIAGNOSIS — F909 Attention-deficit hyperactivity disorder, unspecified type: Secondary | ICD-10-CM

## 2021-06-22 MED ORDER — VITAMIN D (ERGOCALCIFEROL) 1.25 MG (50000 UNIT) PO CAPS: 50000.0000 [IU] | ORAL_CAPSULE | ORAL | 1 refills | Status: DC

## 2021-06-22 MED ORDER — AMPHETAMINE-DEXTROAMPHET ER 30 MG PO CP24: 30.0000 mg | ORAL_CAPSULE | Freq: Every day | ORAL | 0 refills | Status: DC

## 2021-07-21 NOTE — Addendum Note (Signed)
Addended by: Veronda Prude on: 07/21/2021 03:24 PM   Modules accepted: Orders

## 2021-07-22 MED ORDER — AMPHETAMINE-DEXTROAMPHET ER 30 MG PO CP24: 30.0000 mg | ORAL_CAPSULE | Freq: Every day | ORAL | 0 refills | Status: DC

## 2021-07-22 MED ORDER — VITAMIN D (ERGOCALCIFEROL) 1.25 MG (50000 UNIT) PO CAPS: 50000.0000 [IU] | ORAL_CAPSULE | ORAL | 1 refills | Status: DC

## 2021-07-22 MED ORDER — AMPHETAMINE-DEXTROAMPHETAMINE 10 MG PO TABS: ORAL_TABLET | ORAL | 0 refills | Status: DC

## 2021-08-23 ENCOUNTER — Encounter: Payer: Self-pay | Admitting: Family Medicine

## 2021-08-23 DIAGNOSIS — E559 Vitamin D deficiency, unspecified: Secondary | ICD-10-CM

## 2021-08-23 DIAGNOSIS — F909 Attention-deficit hyperactivity disorder, unspecified type: Secondary | ICD-10-CM

## 2021-08-24 MED ORDER — AMPHETAMINE-DEXTROAMPHET ER 30 MG PO CP24: 30.0000 mg | ORAL_CAPSULE | Freq: Every day | ORAL | 0 refills | Status: DC

## 2021-08-24 MED ORDER — AMPHETAMINE-DEXTROAMPHETAMINE 10 MG PO TABS: ORAL_TABLET | ORAL | 0 refills | Status: DC

## 2021-09-23 ENCOUNTER — Encounter: Payer: Self-pay | Admitting: Family Medicine

## 2021-09-23 MED ORDER — AMPHETAMINE-DEXTROAMPHETAMINE 10 MG PO TABS: ORAL_TABLET | ORAL | 0 refills | Status: DC

## 2021-09-23 MED ORDER — VITAMIN D (ERGOCALCIFEROL) 1.25 MG (50000 UNIT) PO CAPS: 50000.0000 [IU] | ORAL_CAPSULE | ORAL | 1 refills | Status: DC

## 2021-09-23 MED ORDER — AMPHETAMINE-DEXTROAMPHET ER 30 MG PO CP24: 30.0000 mg | ORAL_CAPSULE | Freq: Every day | ORAL | 0 refills | Status: DC

## 2021-09-23 NOTE — Addendum Note (Signed)
Addended by: Talbot Grumbling on: 09/23/2021 10:24 AM   Modules accepted: Orders

## 2021-10-19 NOTE — Progress Notes (Signed)
? ? ?  SUBJECTIVE:  ? ?CHIEF COMPLAINT / HPI:  ? ?ADHD Follow Up  ?- Meds: Adderall XR 30 mg daily, Adderall 10 mg daily. The XR will last him til about 4 PM. He states he has been on this dose of the 30 mg XR since about 5th grade. The 10 mg was added when he was in high school since the XR wouldn't last him the whole day. He more so only uses this as needed, like when he has a busy day at work. Estimates needing about 4x/week. Works Monday-Thursday 7AM-5/6 PM.  ?- Last follow up: August 2022 ?- Denies loss of appetite, GI upset, sleep disturbances, vision changes, behavior problems ?- Weight changes? No, stable.  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  ?- Relationships at home and with peers: Good  ? ?PERTINENT  PMH / PSH:  ?Past Medical History:  ?Diagnosis Date  ? ADHD (attention deficit hyperactivity disorder)   ? Asthma   ? prn inhaler  ? Lateral meniscus tear 02/2017  ? right  ? ? ?OBJECTIVE:  ? ?BP 130/76   Pulse 92   Ht 5\' 11"  (1.803 m)   Wt (!) 332 lb (150.6 kg)   SpO2 96%   BMI 46.30 kg/m?    ? ?General: NAD, pleasant, able to participate in exam ?Respiratory: Normal respiratory effort ?Psych: Normal affect and mood ? ?ASSESSMENT/PLAN:  ? ?ADHD (attention deficit hyperactivity disorder) ?Chronic and long-term. Has been stable on medications for many years. Does not use the 10 mg daily but only as needed. Will try to reduce quantity and send 16 tablets which will allow patient to use during his work week.  ?-Refilled Adderall XR 30 mg, quantity 30 ?-Refilled Adderall 10 mg tablets, quantity 16 ?-F/u in 3 months ?-Can adjust medications as needed; patient mentions having a busier work load during summer months ?  ? ? ? , DO ? Family Medicine Center  ? ?

## 2021-10-19 NOTE — Patient Instructions (Addendum)
It was wonderful to see you today. ? ?Please bring ALL of your medications with you to every visit.  ? ?Today we talked about: ? ?-I have refilled your medication. ?-We are cutting back on the amount of the 10 mg Adderall's you are getting. Take only as needed for busy work days. ?-Follow up in 3 months, we can adjust as needed.  ? ? ?Thank you for choosing [facility].  ? ?Please call [phone] with any questions about today's appointment. ? ?Please be sure to schedule follow up at the front  desk before you leave today.  ? ?Sharion Settler, DO ?PGY-2 Family Medicine   ?

## 2021-10-21 ENCOUNTER — Other Ambulatory Visit: Payer: Self-pay

## 2021-10-21 ENCOUNTER — Ambulatory Visit (INDEPENDENT_AMBULATORY_CARE_PROVIDER_SITE_OTHER): Payer: Medicaid Other | Admitting: Family Medicine

## 2021-10-21 ENCOUNTER — Encounter: Payer: Self-pay | Admitting: Family Medicine

## 2021-10-21 DIAGNOSIS — F909 Attention-deficit hyperactivity disorder, unspecified type: Secondary | ICD-10-CM

## 2021-10-21 MED ORDER — AMPHETAMINE-DEXTROAMPHET ER 30 MG PO CP24: 30.0000 mg | ORAL_CAPSULE | Freq: Every day | ORAL | 0 refills | Status: DC

## 2021-10-21 MED ORDER — AMPHETAMINE-DEXTROAMPHETAMINE 10 MG PO TABS: ORAL_TABLET | ORAL | 0 refills | Status: DC

## 2021-10-21 NOTE — Assessment & Plan Note (Signed)
Chronic and long-term. Has been stable on medications for many years. Does not use the 10 mg daily but only as needed. Will try to reduce quantity and send 16 tablets which will allow patient to use during his work week.  ?-Refilled Adderall XR 30 mg, quantity 30 ?-Refilled Adderall 10 mg tablets, quantity 16 ?-F/u in 3 months ?-Can adjust medications as needed; patient mentions having a busier work load during summer months ? ?

## 2021-11-20 ENCOUNTER — Encounter: Payer: Self-pay | Admitting: Family Medicine

## 2021-11-22 ENCOUNTER — Other Ambulatory Visit: Payer: Self-pay | Admitting: Family Medicine

## 2021-11-22 ENCOUNTER — Telehealth: Payer: Self-pay

## 2021-11-22 DIAGNOSIS — F909 Attention-deficit hyperactivity disorder, unspecified type: Secondary | ICD-10-CM

## 2021-11-22 DIAGNOSIS — E559 Vitamin D deficiency, unspecified: Secondary | ICD-10-CM

## 2021-11-22 MED ORDER — AMPHETAMINE-DEXTROAMPHETAMINE 10 MG PO TABS: ORAL_TABLET | ORAL | 0 refills | Status: DC

## 2021-11-22 MED ORDER — VITAMIN D (ERGOCALCIFEROL) 1.25 MG (50000 UNIT) PO CAPS: 50000.0000 [IU] | ORAL_CAPSULE | ORAL | 1 refills | Status: DC

## 2021-11-22 MED ORDER — AMPHETAMINE-DEXTROAMPHET ER 15 MG PO CP24: 30.0000 mg | ORAL_CAPSULE | ORAL | 0 refills | Status: DC

## 2021-11-22 MED ORDER — AMPHETAMINE-DEXTROAMPHET ER 30 MG PO CP24: 30.0000 mg | ORAL_CAPSULE | Freq: Every day | ORAL | 0 refills | Status: DC

## 2021-11-22 NOTE — Progress Notes (Signed)
Sending in Adderall XR 15 mg instead of 30 mg given shortage at pharmacy. Called [facility] pharmacy to cancel 30 mg prescription. Also advised by pharmacy tech that they only have 40 tables of the 15 mg, which would be enough for a 20 days supply.  ? ?Will order 20 day supply of Adderall XR 15 mg (patient to take 30 mg daily). Will need to prescribe again in less than a month given this shorter supply.  ?

## 2021-11-22 NOTE — Telephone Encounter (Signed)
Patients sister calls nurse line reporting no stock of Adderall 30mg .  ? ?Sister reports they have 15mg  in stock. Sister reports she has called around "all over town" and the 30mg  are not available.  ? ?Will forward to PCP to see if sending double 15mg  is appropriate.  ?

## 2021-12-07 ENCOUNTER — Other Ambulatory Visit: Payer: Medicaid Other

## 2021-12-09 ENCOUNTER — Other Ambulatory Visit (INDEPENDENT_AMBULATORY_CARE_PROVIDER_SITE_OTHER): Payer: Medicaid Other

## 2021-12-09 DIAGNOSIS — Z6841 Body Mass Index (BMI) 40.0 and over, adult: Secondary | ICD-10-CM

## 2021-12-09 LAB — POCT GLYCOSYLATED HEMOGLOBIN (HGB A1C): Hemoglobin A1C: 5.7 % — AB (ref 4.0–5.6)

## 2021-12-22 ENCOUNTER — Other Ambulatory Visit: Payer: Self-pay | Admitting: Family Medicine

## 2021-12-22 DIAGNOSIS — F909 Attention-deficit hyperactivity disorder, unspecified type: Secondary | ICD-10-CM

## 2021-12-22 DIAGNOSIS — E559 Vitamin D deficiency, unspecified: Secondary | ICD-10-CM

## 2021-12-22 MED ORDER — AMPHETAMINE-DEXTROAMPHET ER 15 MG PO CP24: 30.0000 mg | ORAL_CAPSULE | ORAL | 0 refills | Status: DC

## 2021-12-22 MED ORDER — VITAMIN D (ERGOCALCIFEROL) 1.25 MG (50000 UNIT) PO CAPS: 50000.0000 [IU] | ORAL_CAPSULE | ORAL | 1 refills | Status: DC

## 2021-12-22 MED ORDER — AMPHETAMINE-DEXTROAMPHETAMINE 10 MG PO TABS: ORAL_TABLET | ORAL | 0 refills | Status: DC

## 2022-01-18 NOTE — Progress Notes (Deleted)
    SUBJECTIVE:   CHIEF COMPLAINT / HPI:   Ear Discoloration  [name] is a 26 y.o. male who presents to the [facility] clinic today to discuss *** ear discoloration. First noticed ***    PERTINENT  PMH / PSH: ADHD, Vitamin D deficiency   OBJECTIVE:   There were no vitals taken for this visit. ***  General: NAD, pleasant, able to participate in exam HEENT: Normocephalic, *** Respiratory: Normal respiratory effort Skin: warm and dry, no rashes noted, *** Psych: Normal affect and mood  ASSESSMENT/PLAN:   No problem-specific Assessment & Plan notes found for this encounter.     Sabino Dick, DO [facility] Center

## 2022-01-19 ENCOUNTER — Ambulatory Visit: Payer: Medicaid Other

## 2022-01-24 ENCOUNTER — Other Ambulatory Visit: Payer: Self-pay | Admitting: Family Medicine

## 2022-01-24 DIAGNOSIS — F909 Attention-deficit hyperactivity disorder, unspecified type: Secondary | ICD-10-CM

## 2022-01-24 DIAGNOSIS — E559 Vitamin D deficiency, unspecified: Secondary | ICD-10-CM

## 2022-01-24 MED ORDER — AMPHETAMINE-DEXTROAMPHETAMINE 10 MG PO TABS: ORAL_TABLET | ORAL | 0 refills | Status: DC

## 2022-01-24 MED ORDER — AMPHETAMINE-DEXTROAMPHET ER 15 MG PO CP24: 30.0000 mg | ORAL_CAPSULE | ORAL | 0 refills | Status: DC

## 2022-01-24 MED ORDER — VITAMIN D (ERGOCALCIFEROL) 1.25 MG (50000 UNIT) PO CAPS: 50000.0000 [IU] | ORAL_CAPSULE | ORAL | 3 refills | Status: DC

## 2022-01-25 ENCOUNTER — Encounter: Payer: Self-pay | Admitting: *Deleted

## 2022-01-30 NOTE — Progress Notes (Deleted)
    SUBJECTIVE:   CHIEF COMPLAINT / HPI:   ADHD - Meds: Adderall XR 30 mg daily, Adderall 10 mg daily. - Last follow up: in March - Denies loss of appetite, GI upset, sleep disturbances, vision changes, behavior problems - Weight changes? *** - Work performance: Works in Aeronautical engineer. *** - Relationships at home and with peers: ***   PERTINENT  PMH / PSH: ***  OBJECTIVE:   There were no vitals taken for this visit.   General: NAD, pleasant, able to participate in exam Cardiac: RRR, no murmurs. Respiratory: CTAB, normal effort, No wheezes, rales or rhonchi Abdomen: Bowel sounds present, nontender, nondistended, no hepatosplenomegaly. Extremities: no edema or cyanosis. Skin: warm and dry, no rashes noted Neuro: alert, no obvious focal deficits Psych: Normal affect and mood  ASSESSMENT/PLAN:   No problem-specific Assessment & Plan notes found for this encounter.     Sabino Dick, DO [facility]

## 2022-02-01 ENCOUNTER — Ambulatory Visit: Payer: Medicaid Other | Admitting: Family Medicine

## 2022-02-01 ENCOUNTER — Other Ambulatory Visit: Payer: Self-pay | Admitting: Family Medicine

## 2022-02-01 DIAGNOSIS — F909 Attention-deficit hyperactivity disorder, unspecified type: Secondary | ICD-10-CM

## 2022-02-01 NOTE — Telephone Encounter (Signed)
Patient needs refill on both Adderall prescriptions sent to [facility]. Patient had to cancel his appointment today because he had to take his mom to the ED. Rescheduled for PCP's next available. Please advise.

## 2022-02-01 NOTE — Telephone Encounter (Signed)
D/w patient that refill was sent to [facility] on 6/5. He states he did not get a notification about this to his phone like he normally does. He is able to get them in the morning. I encouraged him to call the pharmacy to ensure they have it. He can contact us back if he has any issues. He knows to schedule an appointment soon to follow up on his ADHD.

## 2022-02-02 ENCOUNTER — Telehealth: Payer: Self-pay

## 2022-02-02 ENCOUNTER — Other Ambulatory Visit: Payer: Self-pay | Admitting: Family Medicine

## 2022-02-02 ENCOUNTER — Other Ambulatory Visit (HOSPITAL_COMMUNITY): Payer: Self-pay

## 2022-02-02 MED ORDER — AMPHETAMINE-DEXTROAMPHET ER 15 MG PO CP24
30.0000 mg | ORAL_CAPSULE | ORAL | 0 refills | Status: DC
  Filled 2022-02-02: qty 40, 20d supply, fill #0

## 2022-02-02 MED ORDER — AMPHETAMINE-DEXTROAMPHETAMINE 10 MG PO TABS
ORAL_TABLET | ORAL | 0 refills | Status: DC
  Filled 2022-02-02: qty 16, 16d supply, fill #0

## 2022-02-02 NOTE — Telephone Encounter (Signed)
Received returned phone call regarding rx refills. [facility] does not have stock of extended release adderall. Mother and patient are requesting that prescription be transferred to [facility] outpatient pharmacy.   Called and verified that patient had not picked up any quantity of medication prescribed on 6/5. Pharmacist verifies that patient has not picked up either prescription. Canceled both prescriptions.   [facility]. Pharmacist reports that they have both medications in stock.   Forwarding to PCP to resend to [facility].   Talbot Grumbling, RN

## 2022-02-02 NOTE — Telephone Encounter (Signed)
Called mother and provided with update.  Talbot Grumbling, RN

## 2022-02-02 NOTE — Telephone Encounter (Signed)
Opened in error.   Tel Hevia C Muskaan Smet, RN  

## 2022-02-17 ENCOUNTER — Other Ambulatory Visit: Payer: Self-pay | Admitting: Family Medicine

## 2022-02-17 DIAGNOSIS — F909 Attention-deficit hyperactivity disorder, unspecified type: Secondary | ICD-10-CM

## 2022-02-18 ENCOUNTER — Other Ambulatory Visit: Payer: Self-pay | Admitting: Family Medicine

## 2022-02-18 ENCOUNTER — Other Ambulatory Visit (HOSPITAL_COMMUNITY): Payer: Self-pay

## 2022-02-18 DIAGNOSIS — F909 Attention-deficit hyperactivity disorder, unspecified type: Secondary | ICD-10-CM

## 2022-02-18 NOTE — Telephone Encounter (Signed)
Last filled 2 weeks ago. I attempted to call patient to gather more information on why he was requesting refill early. He stated he was in the woods and then we lost connection. I called back several times but was unsuccessful. Will try again at another time. Would like to inquire additional information prior to sending in an early refill as this is a controlled substance.

## 2022-02-23 ENCOUNTER — Other Ambulatory Visit (HOSPITAL_COMMUNITY): Payer: Self-pay

## 2022-02-23 ENCOUNTER — Other Ambulatory Visit: Payer: Self-pay | Admitting: Family Medicine

## 2022-02-24 ENCOUNTER — Other Ambulatory Visit (HOSPITAL_COMMUNITY): Payer: Self-pay

## 2022-02-28 NOTE — Progress Notes (Signed)
    SUBJECTIVE:   CHIEF COMPLAINT / HPI:   ADHD - Meds: Adderall XR 30 mg daily, Adderall 10 mg daily as needed for busy work days. Needing to take the 10 mg about 4 days a week. Only takes when work is busy after the 30 XR wears off.  - Last follow up: March - Denies loss of appetite, GI upset, sleep disturbances, vision changes, behavior problems - Weight changes? 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 He does not feel his work performance is being hindered - Relationships at home and with peers: Good. Lives with mom  Skin Changes Noticed hypopigmentation to his right ear a few months ago. Has since spread to his neck. Not painful, itchy, or bothersome. Has not been putting any topicals on it.   PERTINENT  PMH / PSH:  Past Medical History:  Diagnosis Date   ADHD (attention deficit hyperactivity disorder)    Asthma    prn inhaler   Lateral meniscus tear 02/2017   right   OBJECTIVE:   BP (!) 147/95   Pulse 89   Wt (!) 333 lb (151 kg)   SpO2 98%   BMI 46.44 kg/m   Vitals:   03/07/22 1430 03/07/22 1502  BP: (!) 144/93 (!) 147/95  Pulse: 89   SpO2: 98%    General: NAD, pleasant, able to participate in exam Skin: Area of hypopigmentation to superior right ear and spots on neck as well (see below) Neuro: alert, no obvious focal deficits Psych: Normal affect and mood     ASSESSMENT/PLAN:   ADHD (attention deficit hyperactivity disorder) Stable on current regimen.  I am concerned that his medication may be causing some hypertension.  He had 2 elevated blood pressure readings today.  We discussed this today and he will return for a follow up next month. If BP still elevated at that time, we discussed cutting back on his dose. Ideally just want him on the lowest effective dose. If still not well controlled with that, we will need to explore BP medications. We also discussed lifestyle modifications to help.    Vitiligo Hypopigmented area on right ear and right-sided neck.  I believe this is most consistent with vitiligo. No treatment at this time. Benign and not bothersome to patient.     David Dick, DO [facility]

## 2022-03-07 ENCOUNTER — Encounter: Payer: Self-pay | Admitting: Family Medicine

## 2022-03-07 ENCOUNTER — Other Ambulatory Visit: Payer: Self-pay

## 2022-03-07 ENCOUNTER — Ambulatory Visit (INDEPENDENT_AMBULATORY_CARE_PROVIDER_SITE_OTHER): Payer: Medicaid Other | Admitting: Family Medicine

## 2022-03-07 DIAGNOSIS — F909 Attention-deficit hyperactivity disorder, unspecified type: Secondary | ICD-10-CM | POA: Diagnosis present

## 2022-03-07 DIAGNOSIS — L80 Vitiligo: Secondary | ICD-10-CM | POA: Insufficient documentation

## 2022-03-07 MED ORDER — AMPHETAMINE-DEXTROAMPHET ER 15 MG PO CP24: 30.0000 mg | ORAL_CAPSULE | ORAL | 0 refills | Status: DC

## 2022-03-07 MED ORDER — AMPHETAMINE-DEXTROAMPHETAMINE 10 MG PO TABS: ORAL_TABLET | ORAL | 0 refills | Status: DC

## 2022-03-07 NOTE — Assessment & Plan Note (Signed)
Hypopigmented area on right ear and right-sided neck.  I believe this is most consistent with vitiligo. No treatment at this time. Benign and not bothersome to patient.

## 2022-03-07 NOTE — Patient Instructions (Signed)
It was wonderful to see you today.  Please bring ALL of your medications with you to every visit.   Today we talked about:  -Your medication may be causing your blood pressure to be elevated. -I have refilled it for now but please come back in one month for a blood pressure recheck. If still elevated at that time, we will have to consider decreasing your medication dose. -We also talked about other ways to improve your blood pressure including diet and exercise. We recommend 150 minutes of exercise a week. This means at least 30 minutes 5 days a week. A diet rich in fruits and vegetables is good! Try to limit salt.  -For every pound you lose, you will decrease your blood pressure by 1 point. These healthy changes will also help you to lose weight!.  -If you still do not have improvement in your blood pressure with medication adjustments, then we consider starting you on blood pressure medication.   -I think your ear discoloration is something called Vitiligo. It is not dangerous. It may spread, but it is hard to know if it will or not.    Thank you for choosing [facility].   Please call [phone] with any questions about today's appointment.  Please be sure to schedule follow up at the front  desk before you leave today.   Sabino Dick, DO PGY-3 Family Medicine

## 2022-03-07 NOTE — Assessment & Plan Note (Signed)
Stable on current regimen.  I am concerned that his medication may be causing some hypertension.  He had 2 elevated blood pressure readings today.  We discussed this today and he will return for a follow up next month. If BP still elevated at that time, we discussed cutting back on his dose. Ideally just want him on the lowest effective dose. If still not well controlled with that, we will need to explore BP medications. We also discussed lifestyle modifications to help.

## 2022-03-30 ENCOUNTER — Encounter (INDEPENDENT_AMBULATORY_CARE_PROVIDER_SITE_OTHER): Payer: Self-pay

## 2022-04-06 ENCOUNTER — Other Ambulatory Visit: Payer: Self-pay

## 2022-04-06 DIAGNOSIS — F909 Attention-deficit hyperactivity disorder, unspecified type: Secondary | ICD-10-CM

## 2022-04-07 MED ORDER — AMPHETAMINE-DEXTROAMPHET ER 15 MG PO CP24: 30.0000 mg | ORAL_CAPSULE | ORAL | 0 refills | Status: DC

## 2022-04-07 MED ORDER — AMPHETAMINE-DEXTROAMPHETAMINE 10 MG PO TABS: ORAL_TABLET | ORAL | 0 refills | Status: DC

## 2022-05-09 ENCOUNTER — Other Ambulatory Visit: Payer: Self-pay

## 2022-05-09 DIAGNOSIS — F909 Attention-deficit hyperactivity disorder, unspecified type: Secondary | ICD-10-CM

## 2022-05-09 MED ORDER — AMPHETAMINE-DEXTROAMPHET ER 15 MG PO CP24: 30.0000 mg | ORAL_CAPSULE | ORAL | 0 refills | Status: DC

## 2022-05-09 MED ORDER — AMPHETAMINE-DEXTROAMPHETAMINE 10 MG PO TABS: ORAL_TABLET | ORAL | 0 refills | Status: DC

## 2022-06-08 ENCOUNTER — Other Ambulatory Visit: Payer: Self-pay

## 2022-06-08 DIAGNOSIS — F909 Attention-deficit hyperactivity disorder, unspecified type: Secondary | ICD-10-CM

## 2022-06-09 MED ORDER — AMPHETAMINE-DEXTROAMPHETAMINE 10 MG PO TABS
10.0000 mg | ORAL_TABLET | ORAL | 0 refills | Status: DC | PRN
  Filled 2022-06-09: qty 16, 16d supply, fill #0

## 2022-06-09 MED ORDER — AMPHETAMINE-DEXTROAMPHET ER 15 MG PO CP24
30.0000 mg | ORAL_CAPSULE | ORAL | 0 refills | Status: DC
  Filled 2022-06-09: qty 40, 20d supply, fill #0

## 2022-06-10 ENCOUNTER — Other Ambulatory Visit (HOSPITAL_COMMUNITY): Payer: Self-pay

## 2022-06-13 ENCOUNTER — Other Ambulatory Visit (HOSPITAL_COMMUNITY): Payer: Self-pay

## 2022-07-03 ENCOUNTER — Other Ambulatory Visit: Payer: Self-pay | Admitting: Family Medicine

## 2022-07-03 DIAGNOSIS — F909 Attention-deficit hyperactivity disorder, unspecified type: Secondary | ICD-10-CM

## 2022-07-04 ENCOUNTER — Other Ambulatory Visit (HOSPITAL_COMMUNITY): Payer: Self-pay

## 2022-07-04 ENCOUNTER — Other Ambulatory Visit: Payer: Self-pay | Admitting: Family Medicine

## 2022-07-04 DIAGNOSIS — F909 Attention-deficit hyperactivity disorder, unspecified type: Secondary | ICD-10-CM

## 2022-07-06 ENCOUNTER — Other Ambulatory Visit: Payer: Self-pay

## 2022-07-06 ENCOUNTER — Other Ambulatory Visit (HOSPITAL_COMMUNITY): Payer: Self-pay

## 2022-07-06 ENCOUNTER — Other Ambulatory Visit: Payer: Self-pay | Admitting: Family Medicine

## 2022-07-06 DIAGNOSIS — F909 Attention-deficit hyperactivity disorder, unspecified type: Secondary | ICD-10-CM

## 2022-07-07 ENCOUNTER — Other Ambulatory Visit (HOSPITAL_COMMUNITY): Payer: Self-pay

## 2022-07-07 ENCOUNTER — Encounter: Payer: Self-pay | Admitting: Family Medicine

## 2022-07-08 ENCOUNTER — Other Ambulatory Visit (HOSPITAL_COMMUNITY): Payer: Self-pay

## 2022-07-08 ENCOUNTER — Other Ambulatory Visit: Payer: Self-pay | Admitting: Family Medicine

## 2022-07-08 DIAGNOSIS — F909 Attention-deficit hyperactivity disorder, unspecified type: Secondary | ICD-10-CM

## 2022-07-08 MED ORDER — AMPHETAMINE-DEXTROAMPHETAMINE 10 MG PO TABS
10.0000 mg | ORAL_TABLET | ORAL | 0 refills | Status: DC | PRN
  Filled 2022-07-08: qty 16, 16d supply, fill #0

## 2022-07-08 MED ORDER — AMPHETAMINE-DEXTROAMPHET ER 30 MG PO CP24
30.0000 mg | ORAL_CAPSULE | ORAL | 0 refills | Status: DC
  Filled 2022-07-08: qty 30, 30d supply, fill #0

## 2022-07-08 NOTE — Progress Notes (Signed)
Refilled Adderall and Adderall XR for patient. Called to discuss his BP with his mother (per pt request). She reports his BP have been normotensive at home, 120s/60s. Discussed his elevated BP at last office visit and recommended f/u for recheck and ADHD f/u. Appt scheduled for 12/4. Discussed that I cannot refill again until he is seen, she is aware.

## 2022-07-12 ENCOUNTER — Other Ambulatory Visit (HOSPITAL_COMMUNITY): Payer: Self-pay

## 2022-07-25 ENCOUNTER — Ambulatory Visit: Payer: Self-pay | Admitting: Family Medicine

## 2022-07-25 NOTE — Progress Notes (Deleted)
    SUBJECTIVE:   CHIEF COMPLAINT / HPI:   [name] is a 26 y.o. male who presents to the [facility] clinic today to discuss the following concerns:   ADHD - Meds: Adderall XR 30 mg daily, Adderall 10 mg as needed for busy work days. - Last follow up: 03/07/22 - Denies loss of appetite, GI upset, sleep disturbances, vision changes, behavior problems - Weight changes? *** - Work performance: *** - Relationships at home and with peers: ***  Hx Blood Pressure Elevation Patient had elevated blood pressure at last visit, see below. He does have home BP cuff and his mother checks his BP from time to time. Home BP readings range ***  BP Readings from Last 3 Encounters:  03/07/22 (!) 147/95  10/21/21 130/76  03/10/21 118/74    PERTINENT  PMH / PSH: Vitamin D deficiency   OBJECTIVE:   There were no vitals taken for this visit. ***  General: NAD, pleasant, able to participate in exam Respiratory: normal effort Extremities: no edema or cyanosis. Psych: Normal affect and mood  ASSESSMENT/PLAN:   No problem-specific Assessment & Plan notes found for this encounter.     Sabino Dick, DO [facility]

## 2022-07-25 NOTE — Patient Instructions (Incomplete)
It was wonderful to see you today.  Please bring ALL of your medications with you to every visit.   Today we talked about:  **  Thank you for coming to your visit as scheduled. We have had a large "no-show" problem lately, and this significantly limits our ability to see and care for patients. As a friendly reminder- if you cannot make your appointment please call to cancel. We do have a no show policy for those who do not cancel within 24 hours. Our policy is that if you miss or fail to cancel an appointment within 24 hours, 3 times in a 6-month period, you may be dismissed from our clinic.   Thank you for choosing [facility].   Please call [phone] with any questions about today's appointment.  Please be sure to schedule follow up at the front  desk before you leave today.   Lillien Petronio, DO PGY-3 Family Medicine   

## 2022-08-04 ENCOUNTER — Ambulatory Visit: Payer: Medicaid Other | Admitting: Family Medicine

## 2022-08-04 ENCOUNTER — Telehealth: Payer: Self-pay | Admitting: Family Medicine

## 2022-08-04 NOTE — Telephone Encounter (Signed)
Called patient to discuss his no-show today. This is his second no show in 10 day time frame. He reports that he was having car troubles and tried to call the office "all day" but was having a hard time getting through. Discussed importance of in office visit for follow up on ADHD and that this is needed as Adderall is a controlled substance that requires follow up, also given hx of elevated BP and that medication can worsen it.   Discussed that ideally we would like >24 hours notice if patient is unable to make appointments. His car troubles are not new, therefore he should have tried to call sooner than today. Patient is aware of this. He lives in [place] and also noted that if he wanted to find a closer office that would be more convenient for him he is welcome to do so. He was advised that he may be eligible for dismissal for a repeated no show in a 19-month time period.   Patient has no-showed to multiple appointments in a 34-month period. Per our no-show policy, a letter has been routed to front staff to be mailed to patient via certified mail regarding likely dismissal for repeat no show.

## 2022-08-09 DIAGNOSIS — J111 Influenza due to unidentified influenza virus with other respiratory manifestations: Secondary | ICD-10-CM | POA: Diagnosis not present

## 2022-08-25 ENCOUNTER — Other Ambulatory Visit (HOSPITAL_COMMUNITY): Payer: Self-pay

## 2022-08-25 ENCOUNTER — Ambulatory Visit (INDEPENDENT_AMBULATORY_CARE_PROVIDER_SITE_OTHER): Payer: Medicaid Other | Admitting: Family Medicine

## 2022-08-25 ENCOUNTER — Encounter: Payer: Self-pay | Admitting: Family Medicine

## 2022-08-25 VITALS — BP 123/80 | HR 80 | Temp 97.9°F | Ht 71.0 in | Wt 346.2 lb

## 2022-08-25 DIAGNOSIS — F909 Attention-deficit hyperactivity disorder, unspecified type: Secondary | ICD-10-CM | POA: Diagnosis not present

## 2022-08-25 DIAGNOSIS — Z23 Encounter for immunization: Secondary | ICD-10-CM

## 2022-08-25 MED ORDER — AMPHETAMINE-DEXTROAMPHET ER 30 MG PO CP24
30.0000 mg | ORAL_CAPSULE | ORAL | 0 refills | Status: DC
  Filled 2022-08-25: qty 30, 30d supply, fill #0

## 2022-08-25 MED ORDER — AMPHETAMINE-DEXTROAMPHETAMINE 10 MG PO TABS
10.0000 mg | ORAL_TABLET | ORAL | 0 refills | Status: DC | PRN
  Filled 2022-08-25: qty 12, 12d supply, fill #0

## 2022-08-25 NOTE — Progress Notes (Signed)
    SUBJECTIVE:   CHIEF COMPLAINT / HPI:   [name] is a 27 y.o. male who presents to the [facility] clinic today to discuss the following concerns:   ADHD - Meds: Adderall XR 30 mg daily, Adderall 10 mg daily as needed for busy work days  - Last follow up: 7/17. He has had a couple missed appointments between then and now due to transportation issues  - Denies loss of appetite, GI upset, sleep disturbances, vision changes, behavior problems - Weight changes? Weight is trending upwards. No loss of appetite - Work performance: Good, somewhat slow since it is the winter time - Relationships at home and with peers: Yes  It should be noted that blood pressure at last visit was 147/95, stayed persistently elevated on recheck. Discussed that his Adderall could be contributing to elevated BP. Today he is normotensive. He has home BP cuff as well.   PERTINENT  PMH / PSH: Intermittently elevated BP, obesity, prediabetes   OBJECTIVE:   BP 123/80   Pulse 80   Temp 97.9 F (36.6 C)   Ht 5\' 11"  (1.803 m)   Wt (!) 346 lb 3.2 oz (157 kg)   SpO2 98%   BMI 48.29 kg/m    General: NAD, pleasant, able to participate in exam Respiratory: normal effort Psych: Normal affect and mood  ASSESSMENT/PLAN:   ADHD (attention deficit hyperactivity disorder) We discussed importance of routine follow-up, he is aware of this.  Given his lower work season we will cut back to 12 tablets of his as needed 10 mg Adderall tablets.  Will continue his 30 mg Adderall XR.  We discussed potentially weaning his Adderall, in general, over time.  Previous concerns regarding his elevated blood pressure.  Today he is normotensive.  Given intermittent elevated readings, he is amenable to 24-hour ambulatory blood pressure monitoring with our pharmacist here. -Scheduled with Dr. Valentina Lucks for 24 hr amb BP monitoring -PDMP reviewed, Adderall XR 30 mg x1 month and Adderall 10 mg PRN for inattention for busy work days (quant #12),  reduced from #16 prescribed -F/u in 3 months  HM Flu vaccine today.   [name], [name]

## 2022-08-25 NOTE — Assessment & Plan Note (Signed)
We discussed importance of routine follow-up, he is aware of this.  Given his lower work season we will cut back to 12 tablets of his as needed 10 mg Adderall tablets.  Will continue his 30 mg Adderall XR.  We discussed potentially weaning his Adderall, in general, over time.  Previous concerns regarding his elevated blood pressure.  Today he is normotensive.  Given intermittent elevated readings, he is amenable to 24-hour ambulatory blood pressure monitoring with our pharmacist here. -Scheduled with Dr. Valentina Lucks for 24 hr amb BP monitoring -PDMP reviewed, Adderall XR 30 mg x1 month and Adderall 10 mg PRN for inattention for busy work days (quant #12), reduced from #16 prescribed -F/u in 3 months

## 2022-08-25 NOTE — Patient Instructions (Addendum)
It was wonderful to see you today.  Please bring ALL of your medications with you to every visit.   Today we talked about:  I have scheduled him an appointment with our Pharmacist, Dr. Valentina Lucks, for 24 hour blood pressure monitoring.  This will give [name] the best data on his average blood pressure. I will refill his Adderall. We will try to cut back on his 10 mg as needed to 12 tablets monthly given a slower work season.  Thank you for coming to your visit as scheduled. We have had a large "no-show" problem lately, and this significantly limits our ability to see and care for patients. As a friendly reminder- if you cannot make your appointment please call to cancel. We do have a no show policy for those who do not cancel within 24 hours. Our policy is that if you miss or fail to cancel an appointment within 24 hours, 3 times in a 61-month period, you may be dismissed from our clinic.   Thank you for choosing [facility].   Please call [phone] with any questions about today's appointment.  Please be sure to schedule follow up at the front  desk before you leave today.   Sharion Settler, DO PGY-3 Family Medicine

## 2022-08-26 ENCOUNTER — Other Ambulatory Visit (HOSPITAL_COMMUNITY): Payer: Self-pay

## 2022-09-06 ENCOUNTER — Ambulatory Visit: Payer: Medicaid Other | Admitting: Pharmacist

## 2022-09-26 ENCOUNTER — Other Ambulatory Visit (HOSPITAL_COMMUNITY): Payer: Self-pay

## 2022-09-26 ENCOUNTER — Other Ambulatory Visit: Payer: Self-pay | Admitting: Family Medicine

## 2022-09-26 DIAGNOSIS — J111 Influenza due to unidentified influenza virus with other respiratory manifestations: Secondary | ICD-10-CM | POA: Diagnosis not present

## 2022-09-26 MED ORDER — AMPHETAMINE-DEXTROAMPHET ER 30 MG PO CP24
30.0000 mg | ORAL_CAPSULE | ORAL | 0 refills | Status: DC
  Filled 2022-09-26: qty 30, 30d supply, fill #0

## 2022-09-28 ENCOUNTER — Other Ambulatory Visit: Payer: Self-pay | Admitting: Family Medicine

## 2022-09-28 DIAGNOSIS — F909 Attention-deficit hyperactivity disorder, unspecified type: Secondary | ICD-10-CM

## 2022-09-29 ENCOUNTER — Other Ambulatory Visit (HOSPITAL_COMMUNITY): Payer: Self-pay

## 2022-09-29 MED ORDER — AMPHETAMINE-DEXTROAMPHETAMINE 10 MG PO TABS
10.0000 mg | ORAL_TABLET | ORAL | 0 refills | Status: DC | PRN
  Filled 2022-09-29: qty 12, 12d supply, fill #0

## 2022-10-27 ENCOUNTER — Other Ambulatory Visit: Payer: Self-pay | Admitting: Family Medicine

## 2022-10-27 ENCOUNTER — Other Ambulatory Visit (HOSPITAL_COMMUNITY): Payer: Self-pay

## 2022-10-27 DIAGNOSIS — F909 Attention-deficit hyperactivity disorder, unspecified type: Secondary | ICD-10-CM

## 2022-10-27 MED ORDER — AMPHETAMINE-DEXTROAMPHETAMINE 10 MG PO TABS
10.0000 mg | ORAL_TABLET | ORAL | 0 refills | Status: DC | PRN
  Filled 2022-10-27: qty 12, 12d supply, fill #0

## 2022-10-27 MED ORDER — AMPHETAMINE-DEXTROAMPHET ER 30 MG PO CP24
30.0000 mg | ORAL_CAPSULE | ORAL | 0 refills | Status: DC
  Filled 2022-10-27 – 2022-10-31 (×2): qty 30, 30d supply, fill #0

## 2022-10-28 ENCOUNTER — Other Ambulatory Visit (HOSPITAL_COMMUNITY): Payer: Self-pay

## 2022-10-31 ENCOUNTER — Other Ambulatory Visit (HOSPITAL_COMMUNITY): Payer: Self-pay

## 2022-12-01 ENCOUNTER — Other Ambulatory Visit: Payer: Self-pay | Admitting: Family Medicine

## 2022-12-01 DIAGNOSIS — F909 Attention-deficit hyperactivity disorder, unspecified type: Secondary | ICD-10-CM

## 2022-12-02 ENCOUNTER — Other Ambulatory Visit (HOSPITAL_COMMUNITY): Payer: Self-pay

## 2022-12-02 MED ORDER — AMPHETAMINE-DEXTROAMPHETAMINE 10 MG PO TABS
10.0000 mg | ORAL_TABLET | ORAL | 0 refills | Status: DC | PRN
Start: 2022-12-02 — End: 2023-02-08
  Filled 2022-12-02: qty 12, 12d supply, fill #0

## 2022-12-02 MED ORDER — AMPHETAMINE-DEXTROAMPHET ER 30 MG PO CP24
30.0000 mg | ORAL_CAPSULE | ORAL | 0 refills | Status: DC
  Filled 2022-12-02: qty 30, 30d supply, fill #0

## 2023-02-01 ENCOUNTER — Ambulatory Visit: Payer: Medicaid Other | Admitting: Family Medicine

## 2023-02-08 ENCOUNTER — Other Ambulatory Visit (HOSPITAL_COMMUNITY): Payer: Self-pay

## 2023-02-08 ENCOUNTER — Ambulatory Visit (INDEPENDENT_AMBULATORY_CARE_PROVIDER_SITE_OTHER): Payer: Medicaid Other | Admitting: Family Medicine

## 2023-02-08 VITALS — BP 124/84 | HR 81 | Ht 71.0 in | Wt 366.2 lb

## 2023-02-08 DIAGNOSIS — G8929 Other chronic pain: Secondary | ICD-10-CM

## 2023-02-08 DIAGNOSIS — M25561 Pain in right knee: Secondary | ICD-10-CM | POA: Diagnosis not present

## 2023-02-08 DIAGNOSIS — R7303 Prediabetes: Secondary | ICD-10-CM

## 2023-02-08 DIAGNOSIS — E559 Vitamin D deficiency, unspecified: Secondary | ICD-10-CM

## 2023-02-08 DIAGNOSIS — F909 Attention-deficit hyperactivity disorder, unspecified type: Secondary | ICD-10-CM | POA: Diagnosis not present

## 2023-02-08 DIAGNOSIS — J4520 Mild intermittent asthma, uncomplicated: Secondary | ICD-10-CM | POA: Diagnosis not present

## 2023-02-08 LAB — POCT GLYCOSYLATED HEMOGLOBIN (HGB A1C): HbA1c, POC (controlled diabetic range): 6 % (ref 0.0–7.0)

## 2023-02-08 MED ORDER — VITAMIN D (ERGOCALCIFEROL) 1.25 MG (50000 UNIT) PO CAPS
50000.0000 [IU] | ORAL_CAPSULE | ORAL | 3 refills | Status: DC
Start: 2023-02-08 — End: 2024-03-14
  Filled 2023-02-08: qty 12, 84d supply, fill #0
  Filled 2023-05-15: qty 12, 84d supply, fill #1

## 2023-02-08 MED ORDER — AMPHETAMINE-DEXTROAMPHETAMINE 10 MG PO TABS
10.0000 mg | ORAL_TABLET | ORAL | 0 refills | Status: DC | PRN
Start: 2023-02-08 — End: 2023-03-06
  Filled 2023-02-08 – 2023-02-10 (×2): qty 15, 15d supply, fill #0

## 2023-02-08 MED ORDER — DICLOFENAC SODIUM 1 % EX GEL
2.0000 g | Freq: Four times a day (QID) | CUTANEOUS | 0 refills | Status: AC
Start: 2023-02-08 — End: ?
  Filled 2023-02-08: qty 300, 30d supply, fill #0
  Filled 2023-02-08: qty 100, 34d supply, fill #0

## 2023-02-08 MED ORDER — ALBUTEROL SULFATE HFA 108 (90 BASE) MCG/ACT IN AERS
2.0000 | INHALATION_SPRAY | Freq: Four times a day (QID) | RESPIRATORY_TRACT | 0 refills | Status: DC | PRN
Start: 2023-02-08 — End: 2023-05-15
  Filled 2023-02-08: qty 18, 15d supply, fill #0

## 2023-02-08 MED ORDER — AMPHETAMINE-DEXTROAMPHET ER 30 MG PO CP24
30.0000 mg | ORAL_CAPSULE | ORAL | 0 refills | Status: DC
Start: 2023-02-08 — End: 2023-03-13
  Filled 2023-02-08: qty 30, 30d supply, fill #0

## 2023-02-08 NOTE — Assessment & Plan Note (Addendum)
Well controlled; however, needs increase during more busy times. PCP has given 15 prn for ADHD symptoms. PDMP reviewed and is appropriate.

## 2023-02-08 NOTE — Patient Instructions (Signed)
It was wonderful to see you today.  Please bring ALL of your medications with you to every visit.   Today we talked about:  Weight loss - I will let you know the results of your A1c and if you can get ozempic covered.   ADHD - I refilled your medications.   Knee pain - Use the voltaren gel, take ibuprofen 600-800 every 8 hours as needed with food and do not use bc powder if you are taking ibuprofen. I have referred you to sports medicine.   Please follow up in 3 months   Thank you for choosing [facility].   Please call [phone] with any questions about today's appointment.  Please be sure to schedule follow up at the front desk before you leave today.   Lockie Mola, MD  Family Medicine

## 2023-02-08 NOTE — Progress Notes (Signed)
    SUBJECTIVE:   CHIEF COMPLAINT / HPI:   Prediabetes Patient is interested in ozempic if he qualifies for it. He has been attempting to make some lifestyle changes in terms of more walking but has not had much success due to his knee pain.   ADHD Adderal makes him less hungry, but reports he does eat and is better at reminding himself to eat now than when he was young. 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    Knee Pain  Had surgery in 2018 for lateral meniscus tear. Hurts more when he walks. He is having clicking and locking in his knee. For 3 days straight last week could not go to work. Has not seen sports med or surgeon since surgery. Has tried resting it as well as taking BC powder.   PERTINENT  PMH / PSH: Arthroscopy for L lateral meniscus tear, prediabetes, ADHD, elevated blood pressure reading  OBJECTIVE:   BP 124/84   Pulse 81   Ht 5\' 11"  (1.803 m)   Wt (!) 366 lb 3.2 oz (166.1 kg)   SpO2 98%   BMI 51.07 kg/m   General: well appearing, obese, in no acute distress CV: RRR, radial pulses equal and palpable, no BLE edema  Resp: Normal work of breathing on room air, CTAB Abd: Soft, non tender, non distended  Neuro: Alert & Oriented x 4  Skin: acanthosis nigricans,  MSK: R knee: anterior drawer test negative, pain on valgus maneuver, worse pain and varus maneuver, pain to palpation of medial knee joint. L knee negative tests no pain     ASSESSMENT/PLAN:   Prediabetes Assessment & Plan: Has gained 20 lbs in the last 6 months. Patient would benefit from GLP 1.  - Continue to counsel on lifestyle management  - F/u A1c   Orders: -     POCT glycosylated hemoglobin (Hb A1C)  Vitamin D deficiency -     Vitamin D (Ergocalciferol); Take 1 capsule (50,000 Units total) by mouth every 7 (seven) days.  Dispense: 12 capsule; Refill: 3  Attention deficit hyperactivity disorder (ADHD), unspecified ADHD type Assessment & Plan: Well  controlled; however, needs increase during more busy times. PCP has given 15 prn for ADHD symptoms. PDMP reviewed and is appropriate.   Orders: -     Amphetamine-Dextroamphetamine; Take 1 tablet (10 mg total) by mouth as needed.  Dispense: 15 tablet; Refill: 0 -     Amphetamine-Dextroamphet ER; Take 1 capsule (30 mg total) by mouth every morning. You are OVERDUE for an appointment, please schedule.  Dispense: 30 capsule; Refill: 0  Mild intermittent asthma without complication -     Albuterol Sulfate HFA; Inhale 2 puffs into the lungs every 6 (six) hours as needed for wheezing or shortness of breath.  Dispense: 18 g; Refill: 0  Chronic pain of right knee Assessment & Plan: Concern for degeneration within the knee joint given worsening pain in knee with history of traumatic meniscal tear. Could be wear of the medial meniscus due to weight shift from lateral side (previously torn).   Orders: -     Diclofenac Sodium; Apply 2 g topically 4 (four) times daily.  Dispense: 400 g; Refill: 0 -     Ambulatory referral to Sports Medicine      Lockie Mola, MD [facility]

## 2023-02-10 ENCOUNTER — Other Ambulatory Visit (HOSPITAL_COMMUNITY): Payer: Self-pay

## 2023-02-10 NOTE — Assessment & Plan Note (Signed)
Has gained 20 lbs in the last 6 months. Patient would benefit from GLP 1.  - Continue to counsel on lifestyle management  - F/u A1c

## 2023-02-10 NOTE — Assessment & Plan Note (Signed)
Concern for degeneration within the knee joint given worsening pain in knee with history of traumatic meniscal tear. Could be wear of the medial meniscus due to weight shift from lateral side (previously torn).

## 2023-02-17 ENCOUNTER — Ambulatory Visit: Payer: Medicaid Other | Admitting: Sports Medicine

## 2023-02-17 VITALS — BP 133/82 | Ht 71.0 in | Wt 366.0 lb

## 2023-02-17 DIAGNOSIS — G8929 Other chronic pain: Secondary | ICD-10-CM

## 2023-02-17 DIAGNOSIS — M25561 Pain in right knee: Secondary | ICD-10-CM

## 2023-02-17 NOTE — Progress Notes (Signed)
   Established Patient Office Visit  Subjective   Patient ID: [name], male    DOB: [birth year]  Age: 27 y.o. MRN: [id number]  Right knee pain.  [name] is here today with chief complaint of right knee pain.  He reports it has been bothering him since last summer but has worsened recently.  He works in [employer]eron[employer] and it is becoming more difficult and painful for him at the end of the day.  He is having some diffuse anterior knee pain.  He noticed some some swelling after has been on his feet for prolonged periods of time.  He has discomfort with standing squatting stairs.  He denies any true locking of his knee but feels like it does get stiff.  Of note he had right knee arthroscopic surgery at [facility] in 2017 however he is unable to recall exactly what procedure he had done.   ROS as listed above in HPI    Objective:     BP 133/82   Ht 5\' 11"  (1.803 m)   Wt (!) 366 lb (166 kg)   BMI 51.05 kg/m   Physical Exam Vitals reviewed.  Constitutional:      General: He is not in acute distress.    Appearance: Normal appearance. He is obese. He is not ill-appearing, toxic-appearing or diaphoretic.  Neurological:     Mental Status: He is alert.   Right knee: No obvious deformity or asymmetry.  No ecchymosis edema or effusion.  He has full range of motion passively from 0 to 110 degrees.  Actively he has some discomfort with full extension.  Tenderness to palpation diffusely but most at the superior aspect of the patella and medial joint line.  Stable to varus and valgus stress.  Negative McMurray's.  Negative anterior drawer.  Normal gait.  He has some weakness 4/5 with resisted knee flexion and extension.  Crepitus present throughout some range of motion testing.    Assessment & Plan:   Problem List Items Addressed This Visit       Other   Chronic pain of right knee - Primary    Patient has no acute injury to his knee, I suspect there is a component of patellofemoral pain  syndrome versus osteoarthritis at this time.  I have sent an order for formal physical therapy to work on quad strengthening as he has some deficits.  Recommend she continue with ice and oral anti-inflammatories as needed.  We will follow-up with him after he has completed 5 to 6 weeks of formal physical therapy.  Should he have continued pain after quadricep strengthening would recommend further imaging studies.      Relevant Orders   Ambulatory referral to Physical Therapy    Return in about 6 weeks (around 03/31/2023).    Claudie Leach, DO    I was the preceptor for this visit and available for immediate consultation Marsa Aris, DO

## 2023-02-17 NOTE — Assessment & Plan Note (Signed)
Patient has no acute injury to his knee, I suspect there is a component of patellofemoral pain syndrome versus osteoarthritis at this time.  I have sent an order for formal physical therapy to work on quad strengthening as he has some deficits.  Recommend she continue with ice and oral anti-inflammatories as needed.  We will follow-up with him after he has completed 5 to 6 weeks of formal physical therapy.  Should he have continued pain after quadricep strengthening would recommend further imaging studies.

## 2023-02-21 NOTE — Progress Notes (Signed)
Reviewed and agree with Dr Koval's plan.   

## 2023-03-06 ENCOUNTER — Other Ambulatory Visit: Payer: Self-pay | Admitting: Family Medicine

## 2023-03-06 DIAGNOSIS — F909 Attention-deficit hyperactivity disorder, unspecified type: Secondary | ICD-10-CM

## 2023-03-07 ENCOUNTER — Other Ambulatory Visit (HOSPITAL_COMMUNITY): Payer: Self-pay

## 2023-03-07 MED ORDER — AMPHETAMINE-DEXTROAMPHETAMINE 10 MG PO TABS
10.0000 mg | ORAL_TABLET | ORAL | 0 refills | Status: DC | PRN
Start: 2023-03-07 — End: 2023-04-13
  Filled 2023-03-07: qty 15, 15d supply, fill #0

## 2023-03-09 ENCOUNTER — Ambulatory Visit: Payer: Medicaid Other | Attending: Sports Medicine | Admitting: Physical Therapy

## 2023-03-13 ENCOUNTER — Other Ambulatory Visit (HOSPITAL_COMMUNITY): Payer: Self-pay

## 2023-03-13 ENCOUNTER — Other Ambulatory Visit: Payer: Self-pay | Admitting: Family Medicine

## 2023-03-13 DIAGNOSIS — F909 Attention-deficit hyperactivity disorder, unspecified type: Secondary | ICD-10-CM

## 2023-03-13 MED ORDER — AMPHETAMINE-DEXTROAMPHET ER 30 MG PO CP24
30.0000 mg | ORAL_CAPSULE | ORAL | 0 refills | Status: DC
Start: 2023-03-13 — End: 2023-04-13
  Filled 2023-03-13: qty 30, 30d supply, fill #0

## 2023-03-15 ENCOUNTER — Other Ambulatory Visit (HOSPITAL_COMMUNITY): Payer: Self-pay

## 2023-04-13 ENCOUNTER — Other Ambulatory Visit: Payer: Self-pay | Admitting: Family Medicine

## 2023-04-13 DIAGNOSIS — F909 Attention-deficit hyperactivity disorder, unspecified type: Secondary | ICD-10-CM

## 2023-04-16 MED ORDER — AMPHETAMINE-DEXTROAMPHETAMINE 10 MG PO TABS
10.0000 mg | ORAL_TABLET | ORAL | 0 refills | Status: DC | PRN
Start: 2023-04-16 — End: 2023-05-15
  Filled 2023-04-16: qty 15, 15d supply, fill #0

## 2023-04-16 MED ORDER — AMPHETAMINE-DEXTROAMPHET ER 30 MG PO CP24
30.0000 mg | ORAL_CAPSULE | ORAL | 0 refills | Status: AC
Start: 2023-04-16 — End: ?
  Filled 2023-04-16: qty 30, 30d supply, fill #0

## 2023-04-17 ENCOUNTER — Other Ambulatory Visit (HOSPITAL_COMMUNITY): Payer: Self-pay

## 2023-04-18 ENCOUNTER — Other Ambulatory Visit (HOSPITAL_COMMUNITY): Payer: Self-pay

## 2023-04-19 ENCOUNTER — Other Ambulatory Visit: Payer: Self-pay

## 2023-04-19 ENCOUNTER — Other Ambulatory Visit (HOSPITAL_COMMUNITY): Payer: Self-pay

## 2023-05-15 ENCOUNTER — Other Ambulatory Visit: Payer: Self-pay | Admitting: Family Medicine

## 2023-05-15 DIAGNOSIS — J4520 Mild intermittent asthma, uncomplicated: Secondary | ICD-10-CM

## 2023-05-15 DIAGNOSIS — F909 Attention-deficit hyperactivity disorder, unspecified type: Secondary | ICD-10-CM

## 2023-05-16 ENCOUNTER — Other Ambulatory Visit: Payer: Self-pay

## 2023-05-16 ENCOUNTER — Other Ambulatory Visit (HOSPITAL_COMMUNITY): Payer: Self-pay

## 2023-05-16 MED ORDER — ALBUTEROL SULFATE HFA 108 (90 BASE) MCG/ACT IN AERS
2.0000 | INHALATION_SPRAY | Freq: Four times a day (QID) | RESPIRATORY_TRACT | 0 refills | Status: DC | PRN
  Filled 2023-05-16: qty 18, 25d supply, fill #0

## 2023-05-16 MED ORDER — AMPHETAMINE-DEXTROAMPHETAMINE 10 MG PO TABS
10.0000 mg | ORAL_TABLET | ORAL | 0 refills | Status: DC | PRN
Start: 2023-05-16 — End: 2023-06-20
  Filled 2023-05-16: qty 15, 15d supply, fill #0

## 2023-05-22 ENCOUNTER — Other Ambulatory Visit: Payer: Self-pay | Admitting: Family Medicine

## 2023-05-22 DIAGNOSIS — F909 Attention-deficit hyperactivity disorder, unspecified type: Secondary | ICD-10-CM

## 2023-05-23 ENCOUNTER — Other Ambulatory Visit (HOSPITAL_COMMUNITY): Payer: Self-pay

## 2023-05-23 MED ORDER — AMPHETAMINE-DEXTROAMPHET ER 30 MG PO CP24
30.0000 mg | ORAL_CAPSULE | ORAL | 0 refills | Status: DC
Start: 2023-05-23 — End: 2023-06-26
  Filled 2023-05-23: qty 30, 30d supply, fill #0

## 2023-06-20 ENCOUNTER — Other Ambulatory Visit: Payer: Self-pay | Admitting: Family Medicine

## 2023-06-20 DIAGNOSIS — F909 Attention-deficit hyperactivity disorder, unspecified type: Secondary | ICD-10-CM

## 2023-06-21 ENCOUNTER — Other Ambulatory Visit (HOSPITAL_COMMUNITY): Payer: Self-pay

## 2023-06-21 MED ORDER — AMPHETAMINE-DEXTROAMPHETAMINE 10 MG PO TABS
10.0000 mg | ORAL_TABLET | ORAL | 0 refills | Status: DC | PRN
Start: 2023-06-21 — End: 2023-07-14
  Filled 2023-06-21: qty 15, 15d supply, fill #0

## 2023-06-22 ENCOUNTER — Other Ambulatory Visit (HOSPITAL_COMMUNITY): Payer: Self-pay

## 2023-06-26 ENCOUNTER — Other Ambulatory Visit: Payer: Self-pay | Admitting: Family Medicine

## 2023-06-26 DIAGNOSIS — F909 Attention-deficit hyperactivity disorder, unspecified type: Secondary | ICD-10-CM

## 2023-06-27 ENCOUNTER — Other Ambulatory Visit (HOSPITAL_COMMUNITY): Payer: Self-pay

## 2023-06-27 MED ORDER — AMPHETAMINE-DEXTROAMPHET ER 30 MG PO CP24
30.0000 mg | ORAL_CAPSULE | ORAL | 0 refills | Status: DC
Start: 2023-06-27 — End: 2023-07-27
  Filled 2023-06-27: qty 30, 30d supply, fill #0

## 2023-07-14 ENCOUNTER — Other Ambulatory Visit: Payer: Self-pay | Admitting: Family Medicine

## 2023-07-14 DIAGNOSIS — F909 Attention-deficit hyperactivity disorder, unspecified type: Secondary | ICD-10-CM

## 2023-07-17 ENCOUNTER — Other Ambulatory Visit (HOSPITAL_COMMUNITY): Payer: Self-pay

## 2023-07-17 MED ORDER — AMPHETAMINE-DEXTROAMPHETAMINE 10 MG PO TABS
10.0000 mg | ORAL_TABLET | ORAL | 0 refills | Status: DC | PRN
Start: 2023-07-17 — End: 2023-09-07
  Filled 2023-07-17: qty 15, 15d supply, fill #0

## 2023-07-27 ENCOUNTER — Other Ambulatory Visit: Payer: Self-pay | Admitting: Family Medicine

## 2023-07-27 DIAGNOSIS — F909 Attention-deficit hyperactivity disorder, unspecified type: Secondary | ICD-10-CM

## 2023-08-01 ENCOUNTER — Other Ambulatory Visit (HOSPITAL_COMMUNITY): Payer: Self-pay

## 2023-08-01 ENCOUNTER — Encounter: Payer: Self-pay | Admitting: Family Medicine

## 2023-08-01 ENCOUNTER — Encounter (HOSPITAL_COMMUNITY): Payer: Self-pay

## 2023-08-01 DIAGNOSIS — F909 Attention-deficit hyperactivity disorder, unspecified type: Secondary | ICD-10-CM

## 2023-08-04 ENCOUNTER — Other Ambulatory Visit (HOSPITAL_COMMUNITY): Payer: Self-pay

## 2023-08-04 MED ORDER — AMPHETAMINE-DEXTROAMPHET ER 30 MG PO CP24
30.0000 mg | ORAL_CAPSULE | ORAL | 0 refills | Status: DC
Start: 2023-08-04 — End: 2023-09-11
  Filled 2023-08-04: qty 30, 30d supply, fill #0

## 2023-08-07 ENCOUNTER — Other Ambulatory Visit (HOSPITAL_COMMUNITY): Payer: Self-pay

## 2023-09-06 ENCOUNTER — Other Ambulatory Visit: Payer: Self-pay | Admitting: Family Medicine

## 2023-09-06 ENCOUNTER — Other Ambulatory Visit: Payer: Self-pay

## 2023-09-06 DIAGNOSIS — F909 Attention-deficit hyperactivity disorder, unspecified type: Secondary | ICD-10-CM

## 2023-09-07 ENCOUNTER — Other Ambulatory Visit: Payer: Self-pay | Admitting: *Deleted

## 2023-09-07 DIAGNOSIS — F909 Attention-deficit hyperactivity disorder, unspecified type: Secondary | ICD-10-CM

## 2023-09-08 ENCOUNTER — Other Ambulatory Visit (HOSPITAL_COMMUNITY): Payer: Self-pay

## 2023-09-10 ENCOUNTER — Encounter: Payer: Self-pay | Admitting: Family Medicine

## 2023-09-10 DIAGNOSIS — F909 Attention-deficit hyperactivity disorder, unspecified type: Secondary | ICD-10-CM

## 2023-09-11 ENCOUNTER — Other Ambulatory Visit (HOSPITAL_COMMUNITY): Payer: Self-pay

## 2023-09-11 MED ORDER — AMPHETAMINE-DEXTROAMPHETAMINE 10 MG PO TABS
10.0000 mg | ORAL_TABLET | ORAL | 0 refills | Status: DC | PRN
  Filled 2023-09-11: qty 5, 5d supply, fill #0

## 2023-09-12 ENCOUNTER — Other Ambulatory Visit: Payer: Self-pay

## 2023-09-12 ENCOUNTER — Other Ambulatory Visit (HOSPITAL_COMMUNITY): Payer: Self-pay

## 2023-09-12 MED ORDER — AMPHETAMINE-DEXTROAMPHETAMINE 10 MG PO TABS
10.0000 mg | ORAL_TABLET | ORAL | 0 refills | Status: DC | PRN
  Filled 2023-09-12: qty 15, 15d supply, fill #0

## 2023-09-12 MED ORDER — AMPHETAMINE-DEXTROAMPHET ER 30 MG PO CP24
30.0000 mg | ORAL_CAPSULE | ORAL | 0 refills | Status: DC
  Filled 2023-09-12: qty 30, 30d supply, fill #0

## 2023-09-12 NOTE — Telephone Encounter (Signed)
PDMP reviewed and appropriate.  Refilling per PCP instruction to increase as needed dosing to 15 tablets.

## 2023-09-13 ENCOUNTER — Other Ambulatory Visit: Payer: Self-pay

## 2023-09-13 NOTE — Progress Notes (Signed)
  SUBJECTIVE:   CHIEF COMPLAINT / HPI:   Wants Mole Removal Has been overgrowing for last 2-3 years. Growing/getting bigger. Never hurts. Started off as simple mole.   PERTINENT  PMH / PSH:    OBJECTIVE:  BP 127/82   Pulse (!) 101   Ht 5\' 11"  (1.803 m)   Wt (!) 353 lb (160.1 kg)   SpO2 94%   BMI 49.23 kg/m  Physical Exam Skin:    Findings: Lesion present.          Comments: Pedunculated lesion extending from upper face, just under left eye      ASSESSMENT/PLAN:   Assessment & Plan Skin lesion of face Patient comes in for evaluation and possible removal of lesion on face.  Patient appreciate lesions been there 2 to 3 years, started off as a small mole, but has slowly elongated and gotten longer.  Patient denies any irritation or itching, drainage or pain.  Discussed case with Dr. Lum Babe, who felt patient be good for skin clinic.  Patient was scheduled for procedure visit in skin clinic next week.  Patient appears to be pedunculated hypertrophied scar, versus mole. - Follow-up skin clinic for possible removal No follow-ups on file. Bess Kinds, MD 09/14/2023, 11:34 AM PGY-3, [facility]

## 2023-09-13 NOTE — Patient Instructions (Signed)
It was great to see you! Thank you for allowing me to participate in your care!  I recommend that you always bring your medications to each appointment as this makes it easy to ensure we are on the correct medications and helps us not miss when refills are needed.  Our plans for today:  - *** -   We are checking some labs today, I will call you if they are abnormal will send you a MyChart message or a letter if they are normal.  If you do not hear about your labs in the next 2 weeks please let us know.***  Take care and seek immediate care sooner if you develop any concerns.   Dr. Akeiba Axelson, MD [facility]  

## 2023-09-14 ENCOUNTER — Ambulatory Visit: Payer: Medicaid Other

## 2023-09-14 VITALS — BP 127/82 | HR 101 | Ht 71.0 in | Wt 353.0 lb

## 2023-09-14 DIAGNOSIS — L989 Disorder of the skin and subcutaneous tissue, unspecified: Secondary | ICD-10-CM | POA: Diagnosis present

## 2023-09-14 NOTE — Assessment & Plan Note (Addendum)
Patient comes in for evaluation and possible removal of lesion on face.  Patient appreciate lesions been there 2 to 3 years, started off as a small mole, but has slowly elongated and gotten longer.  Patient denies any irritation or itching, drainage or pain.  Discussed case with Dr. Lum Babe, who felt patient be good for skin clinic.  Patient was scheduled for procedure visit in skin clinic next week.  Patient appears to be pedunculated hypertrophied scar, versus mole. - Follow-up skin clinic for possible removal

## 2023-09-20 ENCOUNTER — Ambulatory Visit: Payer: Self-pay

## 2023-09-26 ENCOUNTER — Other Ambulatory Visit (HOSPITAL_COMMUNITY): Payer: Self-pay

## 2023-09-27 ENCOUNTER — Ambulatory Visit (INDEPENDENT_AMBULATORY_CARE_PROVIDER_SITE_OTHER): Payer: Medicaid Other | Admitting: Family Medicine

## 2023-09-27 VITALS — BP 120/72 | HR 85 | Temp 97.6°F | Ht 71.0 in | Wt 360.4 lb

## 2023-09-27 DIAGNOSIS — L989 Disorder of the skin and subcutaneous tissue, unspecified: Secondary | ICD-10-CM

## 2023-09-27 NOTE — Patient Instructions (Signed)
 It was great to see you! Thank you for allowing me to participate in your care!  Our plans for today:  - Your skin tag was removed today. - It should heal naturally with in the next week. - If you noticed and redness, swelling or fever, please return to care. - You may continue to place a bandaid over it until healed.   Please arrive 15 minutes PRIOR to your next scheduled appointment time! If you do not, this affects OTHER patients' care.  Take care and seek immediate care sooner if you develop any concerns.   Ozell Provencal, MD, PGY-2 [facility] 11:15 AM 09/27/2023  [facility]

## 2023-09-27 NOTE — Assessment & Plan Note (Signed)
 Procedure performed as below. Return precautions for infection, bleeding, ocular issues discussed. No follow-up needed. Wound care provided.

## 2023-09-27 NOTE — Progress Notes (Signed)
    SUBJECTIVE:   CHIEF COMPLAINT / HPI: facial skin lesion  Presenting today for excision of facial skin tag. Notes that since last visit son did scratch at it some and it is now shorter. Does not bother him. No new symptoms. Discussed procedure.  PERTINENT  PMH / PSH: Lesion on face.  OBJECTIVE:  Exam BP 120/72 (Cuff Size: Large)   Pulse 85   Temp 97.6 F (36.4 C)   Ht 5' 11 (1.803 m)   Wt (!) 360 lb 6.4 oz (163.5 kg)   SpO2 97%   BMI 50.27 kg/m   General: NAD, well appearing Neuro: A&O Respiratory: normal WOB on RA Extremities: Moving all 4 extremities equally Face: Skin tag present left just inferior to lateral angle of eye PRE _OP IMAGE  ASSESSMENT/PLAN:   Assessment & Plan Skin lesion of face Procedure performed as below. Return precautions for infection, bleeding, ocular issues discussed. No follow-up needed. Wound care provided.    PRE-OP DIAGNOSIS: Left facial skin tag  POST-OP DIAGNOSIS: Same   PROCEDURE: skin lesion excision  Performing Physician: Ozell Provencal, MD   Supervising Physician (if applicable): Otto Fairly, MD  PROCEDURE:   Scissors Excision  Discussed risks/benefits of procedure, and patient gave consent for excision of skin tag.  Prepped skin in usual sterile fashion.  Used 1cc of 1% lidocaine  without epinephrine  for local anesthesia. Sterile scissors were used to cut skin tag away. Covered wound with sterile bandage and dressing.  EBL <1cc.   The area surrounding the skin lesion was prepared and draped in the  usual sterile manner. The lesion was removed in the usual manner by the method noted above. Hemostasis was assured using Drysol solution.  Closure:   None, bandaid placed.  Followup: The patient tolerated the procedure well without  complications.  Standard post-procedure care is explained and return  precautions are given.   POST-OP IMAGE  Return if symptoms worsen or fail to improve.  Ozell Provencal, MD [facility]

## 2023-09-30 ENCOUNTER — Ambulatory Visit (HOSPITAL_COMMUNITY)
Admission: EM | Admit: 2023-09-30 | Discharge: 2023-09-30 | Disposition: A | Discharge status: Home / Self Care | Payer: Medicaid Other | Attending: Physician Assistant | Admitting: Physician Assistant

## 2023-09-30 ENCOUNTER — Encounter (HOSPITAL_COMMUNITY): Payer: Self-pay

## 2023-09-30 DIAGNOSIS — J029 Acute pharyngitis, unspecified: Secondary | ICD-10-CM | POA: Insufficient documentation

## 2023-09-30 LAB — POCT RAPID STREP A (OFFICE): Rapid Strep A Screen: NEGATIVE

## 2023-09-30 NOTE — Discharge Instructions (Addendum)
 Your rapid strep was negative.  I have sent off a culture to definitively rule this out.   At this time I recommend that you take Tylenol  and ibuprofen  as needed for sore throat.  Please stop vaping and smoking as this is likely caused irritation to the back of your throat.  If at any point you develop severe swelling, pain, fevers, trouble breathing or drooling please go immediately to the emergency room or call 911 for emergency services.

## 2023-09-30 NOTE — ED Provider Notes (Signed)
 [facility]    CSN: [id number] Arrival date & time: 09/30/23  1011      History   Chief Complaint Chief Complaint  Patient presents with   Oral Swelling    HPI [name] is a 28 y.o. male.   HPI  He states he thinks his uvula is swollen as of this AM  He reports he is trying to quit smoking and has bough a vape to assist with this - started vaping about 3 days ago  He reports he did have sore throat this AM when he first woke up but after an hour this had resolved. He denies choking or difficulty swallowing    Past Medical History:  Diagnosis Date   ADHD (attention deficit hyperactivity disorder)    Asthma    prn inhaler   Lateral meniscus tear 02/2017   right    Patient Active Problem List   Diagnosis Date Noted   Skin lesion of face 09/14/2023   Vitiligo 03/07/2022   Encounter to establish care with new doctor 02/24/2021   Headache 12/23/2019   Body mass index (BMI) of 40.0 to 44.9 in adult (HCC) 07/02/2018   Mixed hyperlipidemia 07/02/2018   Essential hypertension 07/02/2018   Failed vision screen 03/14/2018   Chronic pain of right knee 11/22/2016   Acute lateral meniscus tear of right knee 05/22/2016   Acanthosis nigricans 07/09/2014   Prediabetes 07/09/2014   Allergic rhinitis 12/29/2013   Adjustment disorder with anxiety 12/10/2013   Vitamin D  deficiency 07/12/2013   ADHD (attention deficit hyperactivity disorder) 05/15/2013   Obesity 05/15/2013    Past Surgical History:  Procedure Laterality Date   HAND SURGERY Left    as a child   KNEE ARTHROSCOPY WITH LATERAL MENISECTOMY Right 03/22/2017   Procedure: RIGHT KNEE ARTHROSCOPY WITH LATERAL MENISCECTOMY;  Surgeon: Jane Charleston, MD;  Location: [facility];  Service: Orthopedics;  Laterality: Right;       Home Medications    Prior to Admission medications   Medication Sig Start Date End Date Taking? Authorizing Provider  albuterol  (VENTOLIN  HFA) 108 (90 Base)  MCG/ACT inhaler Inhale 2 puffs into the lungs every 6 (six) hours as needed for wheezing or shortness of breath. 05/16/23   Nicholas Bar, MD  amphetamine -dextroamphetamine  (ADDERALL XR) 30 MG 24 hr capsule Take 1 capsule (30 mg total) by mouth every morning. 09/12/23   Theophilus Pagan, MD  amphetamine -dextroamphetamine  (ADDERALL) 10 MG tablet Take 1 tablet (10 mg total) by mouth as needed. Must have visit before next fill 09/12/23   Theophilus Pagan, MD  diclofenac  Sodium (VOLTAREN  ARTHRITIS PAIN) 1 % GEL Apply 2 g topically 4 (four) times daily. 02/08/23   Nicholas Bar, MD  fluticasone  (FLONASE ) 50 MCG/ACT nasal spray Place 2 sprays into both nostrils daily. Patient not taking: Reported on 03/07/2022 04/17/21   Paige, Victoria J, DO  Vitamin D , Ergocalciferol , (DRISDOL ) 1.25 MG (50000 UNIT) CAPS capsule Take 1 capsule (50,000 Units total) by mouth every 7 (seven) days. 02/08/23   Nicholas Bar, MD  cloNIDine  HCl (KAPVAY ) 0.1 MG TB12 ER tablet Take 1 tablet (0.1 mg total) by mouth at bedtime. 01/07/16 03/14/18  Viviana Aleck ONEIDA, FNP  loratadine  (CLARITIN ) 10 MG tablet Take 1 tablet (10 mg total) by mouth daily. 11/10/14 01/27/16  Viviana Aleck ONEIDA, FNP  pantoprazole  (PROTONIX ) 40 MG tablet Take 1 tablet (40 mg total) by mouth daily. 10/05/15 01/27/16  Viviana Aleck ONEIDA, FNP    Family History Family History  Problem Relation Age of Onset   Obesity Mother    Sleep apnea Mother    Diabetes Mother    Hypertension Mother    COPD Other    Hyperlipidemia Other    Hypertension Other    Diabetes Maternal Grandmother    Hypertension Maternal Grandmother    Diabetes Paternal Grandfather     Social History Social History   Tobacco Use   Smoking status: Former    Types: Cigars   Smokeless tobacco: Never  Vaping Use   Vaping status: Never Used  Substance Use Topics   Alcohol use: No    Alcohol/week: 0.0 standard drinks of alcohol   Drug use: No     Allergies   Patient has no known  allergies.   Review of Systems Review of Systems  Constitutional:  Negative for chills and fever.  HENT:  Negative for drooling, sore throat, trouble swallowing and voice change.   Respiratory:  Negative for cough, choking and shortness of breath.      Physical Exam Triage Vital Signs ED Triage Vitals  Encounter Vitals Group     BP 09/30/23 1049 134/83     Systolic BP Percentile --      Diastolic BP Percentile --      Pulse Rate 09/30/23 1049 76     Resp 09/30/23 1049 16     Temp 09/30/23 1049 98.3 F (36.8 C)     Temp Source 09/30/23 1049 Oral     SpO2 09/30/23 1049 94 %     Weight --      Height --      Head Circumference --      Peak Flow --      Pain Score 09/30/23 1050 0     Pain Loc --      Pain Education --      Exclude from Growth Chart --    No data found.  Updated Vital Signs BP 134/83 (BP Location: Right Arm)   Pulse 76   Temp 98.3 F (36.8 C) (Oral)   Resp 16   SpO2 94%   Visual Acuity Right Eye Distance:   Left Eye Distance:   Bilateral Distance:    Right Eye Near:   Left Eye Near:    Bilateral Near:     Physical Exam Vitals reviewed.  Constitutional:      General: He is awake.     Appearance: Normal appearance. He is well-developed and well-groomed.  HENT:     Head: Normocephalic and atraumatic.     Mouth/Throat:     Lips: Pink.     Mouth: Mucous membranes are moist.     Pharynx: Uvula midline. Posterior oropharyngeal erythema present. No uvula swelling.     Tonsils: Tonsillar exudate present. 1+ on the right. 1+ on the left.  Cardiovascular:     Heart sounds: Heart sounds are distant.     Comments: Unable to auscultate heart sounds due to body habitus  Pulmonary:     Effort: Pulmonary effort is normal.     Breath sounds: Normal breath sounds. No decreased air movement. No decreased breath sounds, wheezing, rhonchi or rales.  Lymphadenopathy:     Head:     Right side of head: No submental, submandibular or preauricular adenopathy.      Left side of head: No submental, submandibular or preauricular adenopathy.     Cervical:     Right cervical: No superficial cervical adenopathy.    Left cervical: No superficial cervical  adenopathy.     Upper Body:     Right upper body: No supraclavicular adenopathy.     Left upper body: No supraclavicular adenopathy.  Neurological:     Mental Status: He is alert.  Psychiatric:        Behavior: Behavior is cooperative.      UC Treatments / Results  Labs (all labs ordered are listed, but only abnormal results are displayed) Labs Reviewed  POCT RAPID STREP A (OFFICE) - Normal  CULTURE, GROUP A STREP [facility])    EKG   Radiology No results found.  Procedures Procedures (including critical care time)  Medications Ordered in UC Medications - No data to display  Initial Impression / Assessment and Plan / UC Course  I have reviewed the triage vital signs and the nursing notes.  Pertinent labs & imaging results that were available during my care of the patient were reviewed by me and considered in my medical decision making (see chart for details).     Patient has mild erythema along the posterior oropharynx and uvula.  There is mild (1+) tonsillar swelling and potential exudates.  Will get rapid strep with culture if this is negative.  Reviewed that he should stop vaping and smoking.  Reviewed that this might be causing some erythema and irritation to the back of the throat causing his current symptoms.  Reviewed that he can use over-the-counter Tylenol  and ibuprofen  as needed for sore throat.  Further management to be dictated by results of strep testing.  Final Clinical Impressions(s) / UC Diagnoses   Final diagnoses:  Throat soreness   Rapid strep was negative.  Results reviewed with patient at the end of appointment.  Recommend that he stops vaping and smoking at this time.  Will send off strep culture for definitive rule out.  Recommend using over-the-counter Tylenol   and ibuprofen  as needed for soreness.  Can use warm tea with honey as well.  ED and return precautions reviewed and provided in after visit summary.  Follow-up as needed   Discharge Instructions      Your rapid strep was negative.  I have sent off a culture to definitively rule this out.   At this time I recommend that you take Tylenol  and ibuprofen  as needed for sore throat.  Please stop vaping and smoking as this is likely caused irritation to the back of your throat.  If at any point you develop severe swelling, pain, fevers, trouble breathing or drooling please go immediately to the emergency room or call 911 for emergency services.     ED Prescriptions   None    PDMP not reviewed this encounter.   Wannetta Langland, Rocky BRAVO, PA-C 09/30/23 1146

## 2023-09-30 NOTE — ED Triage Notes (Signed)
 Pt states when he woke this morning his uvula was swollen.  States  he just switched from smoking to vaping and is not sure if that has anything to do with it.

## 2023-10-03 LAB — CULTURE, GROUP A STREP (THRC)

## 2023-10-03 LAB — DERMATOLOGY PATHOLOGY

## 2023-10-04 ENCOUNTER — Encounter: Payer: Self-pay | Admitting: Family Medicine

## 2023-10-22 ENCOUNTER — Other Ambulatory Visit: Payer: Self-pay | Admitting: Family Medicine

## 2023-10-22 DIAGNOSIS — F909 Attention-deficit hyperactivity disorder, unspecified type: Secondary | ICD-10-CM

## 2023-10-24 ENCOUNTER — Other Ambulatory Visit (HOSPITAL_COMMUNITY): Payer: Self-pay

## 2023-10-24 ENCOUNTER — Encounter (HOSPITAL_COMMUNITY): Payer: Self-pay

## 2023-10-24 MED ORDER — AMPHETAMINE-DEXTROAMPHET ER 30 MG PO CP24
30.0000 mg | ORAL_CAPSULE | ORAL | 0 refills | Status: DC
  Filled 2023-10-24: qty 15, 15d supply, fill #0

## 2023-11-08 ENCOUNTER — Ambulatory Visit: Admitting: Student

## 2023-11-08 NOTE — Progress Notes (Deleted)
  SUBJECTIVE:   CHIEF COMPLAINT / HPI:   ***  PERTINENT  PMH / PSH: ADHD, elevated blood pressure, prediabetes, vitamin D deficiency  OBJECTIVE:  There were no vitals taken for this visit. ***  ASSESSMENT/PLAN:   Assessment & Plan  No follow-ups on file. Shelby Mattocks, DO 11/08/2023, 8:05 AM PGY-3, [facility] {    This will disappear when note is signed, click to select method of visit    :1}

## 2023-11-14 ENCOUNTER — Ambulatory Visit: Admitting: Student

## 2023-11-14 NOTE — Progress Notes (Deleted)
  SUBJECTIVE:   CHIEF COMPLAINT / HPI:   ADHD: Currently taking Adderall extended release 30 mg daily with as needed 10 mg tablets.  PERTINENT  PMH / PSH: ADHD, elevated blood pressure, prediabetes, vitamin D deficiency  OBJECTIVE:  There were no vitals taken for this visit. ***  ASSESSMENT/PLAN:   Assessment & Plan  No follow-ups on file. Shelby Mattocks, DO 11/14/2023, 8:08 AM PGY-3, [facility] {    This will disappear when note is signed, click to select method of visit    :1}

## 2023-12-04 ENCOUNTER — Ambulatory Visit (INDEPENDENT_AMBULATORY_CARE_PROVIDER_SITE_OTHER): Admitting: Student

## 2023-12-04 ENCOUNTER — Encounter: Payer: Self-pay | Admitting: Student

## 2023-12-04 ENCOUNTER — Other Ambulatory Visit (HOSPITAL_COMMUNITY): Payer: Self-pay

## 2023-12-04 VITALS — BP 125/83 | HR 84 | Ht 71.0 in | Wt 366.4 lb

## 2023-12-04 DIAGNOSIS — F909 Attention-deficit hyperactivity disorder, unspecified type: Secondary | ICD-10-CM | POA: Diagnosis not present

## 2023-12-04 DIAGNOSIS — E6609 Other obesity due to excess calories: Secondary | ICD-10-CM

## 2023-12-04 DIAGNOSIS — R7303 Prediabetes: Secondary | ICD-10-CM | POA: Diagnosis present

## 2023-12-04 DIAGNOSIS — J4520 Mild intermittent asthma, uncomplicated: Secondary | ICD-10-CM | POA: Diagnosis not present

## 2023-12-04 DIAGNOSIS — I10 Essential (primary) hypertension: Secondary | ICD-10-CM

## 2023-12-04 LAB — POCT GLYCOSYLATED HEMOGLOBIN (HGB A1C): HbA1c, POC (prediabetic range): 5.9 % (ref 5.7–6.4)

## 2023-12-04 MED ORDER — AMPHETAMINE-DEXTROAMPHETAMINE 10 MG PO TABS
10.0000 mg | ORAL_TABLET | Freq: Every day | ORAL | 0 refills | Status: DC | PRN
  Filled 2023-12-04: qty 15, 15d supply, fill #0

## 2023-12-04 MED ORDER — ALBUTEROL SULFATE HFA 108 (90 BASE) MCG/ACT IN AERS: 2.0000 | INHALATION_SPRAY | Freq: Four times a day (QID) | RESPIRATORY_TRACT | 0 refills | Status: DC | PRN

## 2023-12-04 MED ORDER — ALBUTEROL SULFATE HFA 108 (90 BASE) MCG/ACT IN AERS
2.0000 | INHALATION_SPRAY | Freq: Four times a day (QID) | RESPIRATORY_TRACT | 0 refills | Status: AC | PRN
  Filled 2023-12-04: qty 18, 25d supply, fill #0

## 2023-12-04 MED ORDER — AMPHETAMINE-DEXTROAMPHETAMINE 10 MG PO TABS
10.0000 mg | ORAL_TABLET | ORAL | 0 refills | Status: DC | PRN
  Filled 2023-12-04: qty 15, 15d supply, fill #0

## 2023-12-04 MED ORDER — AMPHETAMINE-DEXTROAMPHET ER 30 MG PO CP24
30.0000 mg | ORAL_CAPSULE | ORAL | 0 refills | Status: DC
  Filled 2023-12-04: qty 30, 30d supply, fill #0

## 2023-12-04 MED ORDER — AMLODIPINE BESYLATE 5 MG PO TABS
5.0000 mg | ORAL_TABLET | Freq: Every day | ORAL | 3 refills | Status: AC
Start: 2023-12-04 — End: ?
  Filled 2023-12-04: qty 90, 90d supply, fill #0
  Filled 2024-03-11 (×2): qty 90, 90d supply, fill #1
  Filled 2024-06-10: qty 90, 90d supply, fill #2

## 2023-12-04 MED ORDER — AMPHETAMINE-DEXTROAMPHET ER 30 MG PO CP24: 30.0000 mg | ORAL_CAPSULE | ORAL | 0 refills | Status: DC

## 2023-12-04 MED ORDER — AMPHETAMINE-DEXTROAMPHETAMINE 10 MG PO TABS: 10.0000 mg | ORAL_TABLET | ORAL | 0 refills | Status: DC | PRN

## 2023-12-04 MED ORDER — AMPHETAMINE-DEXTROAMPHETAMINE 10 MG PO TABS
10.0000 mg | ORAL_TABLET | ORAL | 0 refills | Status: DC | PRN
  Filled 2023-12-04: qty 30, 30d supply, fill #0

## 2023-12-04 MED ORDER — AMLODIPINE BESYLATE 5 MG PO TABS: 5.0000 mg | ORAL_TABLET | Freq: Every day | ORAL | 3 refills | Status: DC

## 2023-12-04 NOTE — Assessment & Plan Note (Signed)
 Elevated blood pressure noted. Discussed need for getting antihypertensives given intermittent reports of elevated BPs from prior visits. - Recheck blood pressure. - Rx amlodipine 5 mg daily - Patient to follow-up with PCP in 2 weeks to reassess BP

## 2023-12-04 NOTE — Progress Notes (Signed)
    SUBJECTIVE:   CHIEF COMPLAINT / HPI:   28 year old male with a history of ADHD, presents for a medication refill. He has been on ADHD medication since high school. He reports occasional difficulty sleeping, which he attributes to having children and working. Despite sleeping approximately eight hours a night, he does not feel well-rested. He denies any issues with appetite or heart palpitations. He has not experienced any significant weight loss recently, but is actively trying to lose weight. He reports a history of loud snoring and episodes of apnea during sleep, as observed by his partner. He expresses interest in undergoing a sleep study. He also mentions a desire to lose weight and inquire about weight loss shots.  PERTINENT  PMH / PSH: Reviewed   OBJECTIVE:   BP 125/83   Pulse 84   Ht 5\' 11"  (1.803 m)   Wt (!) 366 lb 6.4 oz (166.2 kg)   SpO2 97%   BMI 51.10 kg/m    Physical Exam General: Alert, morbidly obese, NAD Cardiovascular: RRR, No Murmurs, Normal S2/S2 Respiratory: CTAB, No wheezing or Rales Abdomen: No distension or tenderness  ASSESSMENT/PLAN:   Essential hypertension Elevated blood pressure noted. Discussed need for getting antihypertensives given intermittent reports of elevated BPs from prior visits. - Recheck blood pressure. - Rx amlodipine 5 mg daily - Patient to follow-up with PCP in 2 weeks to reassess BP  ADHD (attention deficit hyperactivity disorder) Stable and patient appears to be tolerating his medications with no significant side effects. - Refilled patient's XR and immediate release Adderall.  Obesity Non-restorative sleep and apneic episodes suggest OSA. Agreed to sleep study for confirmation. - Order split-night study   Goble Last, MD [facility]

## 2023-12-04 NOTE — Patient Instructions (Addendum)
 Pleasure to meet you today.  I have refilled your Adderall.  Also I suspect your sleep issues could be due to sleep apnea.  Placed order for sleep study.  I will submit believe you will benefit from weight loss with diet and and exercise.  Your blood pressure today was elevated I have started you on amlodipine 5 mg which you will take daily for blood pressure.   I recommend checking your blood pressure daily, keeping a blood pressure log and following up  in 2 weeks.

## 2023-12-04 NOTE — Assessment & Plan Note (Signed)
 Stable and patient appears to be tolerating his medications with no significant side effects. - Refilled patient's XR and immediate release Adderall.

## 2023-12-04 NOTE — Assessment & Plan Note (Signed)
 Non-restorative sleep and apneic episodes suggest OSA. Agreed to sleep study for confirmation. - Order split-night study

## 2024-01-02 ENCOUNTER — Emergency Department (HOSPITAL_COMMUNITY)

## 2024-01-02 ENCOUNTER — Emergency Department (HOSPITAL_COMMUNITY)
Admission: EM | Admit: 2024-01-02 | Discharge: 2024-01-02 | Disposition: A | Discharge status: Home / Self Care | Attending: Emergency Medicine | Admitting: Emergency Medicine

## 2024-01-02 ENCOUNTER — Encounter (HOSPITAL_COMMUNITY): Payer: Self-pay | Admitting: Pharmacy Technician

## 2024-01-02 ENCOUNTER — Other Ambulatory Visit: Payer: Self-pay

## 2024-01-02 DIAGNOSIS — R109 Unspecified abdominal pain: Secondary | ICD-10-CM | POA: Diagnosis not present

## 2024-01-02 DIAGNOSIS — R0989 Other specified symptoms and signs involving the circulatory and respiratory systems: Secondary | ICD-10-CM | POA: Diagnosis not present

## 2024-01-02 DIAGNOSIS — R112 Nausea with vomiting, unspecified: Secondary | ICD-10-CM | POA: Insufficient documentation

## 2024-01-02 LAB — URINALYSIS, ROUTINE W REFLEX MICROSCOPIC
Bilirubin Urine: NEGATIVE
Glucose, UA: NEGATIVE mg/dL
Hgb urine dipstick: NEGATIVE
Ketones, ur: NEGATIVE mg/dL
Leukocytes,Ua: NEGATIVE
Nitrite: NEGATIVE
Protein, ur: 30 mg/dL — AB
Specific Gravity, Urine: 1.032 — ABNORMAL HIGH (ref 1.005–1.030)
pH: 5 (ref 5.0–8.0)

## 2024-01-02 LAB — CBC
HCT: 46.3 % (ref 39.0–52.0)
Hemoglobin: 14.9 g/dL (ref 13.0–17.0)
MCH: 29.6 pg (ref 26.0–34.0)
MCHC: 32.2 g/dL (ref 30.0–36.0)
MCV: 92 fL (ref 80.0–100.0)
Platelets: 233 10*3/uL (ref 150–400)
RBC: 5.03 MIL/uL (ref 4.22–5.81)
RDW: 14.6 % (ref 11.5–15.5)
WBC: 9.3 10*3/uL (ref 4.0–10.5)
nRBC: 0 % (ref 0.0–0.2)

## 2024-01-02 LAB — COMPREHENSIVE METABOLIC PANEL WITH GFR
ALT: 30 U/L (ref 0–44)
AST: 24 U/L (ref 15–41)
Albumin: 4 g/dL (ref 3.5–5.0)
Alkaline Phosphatase: 45 U/L (ref 38–126)
Anion gap: 10 (ref 5–15)
BUN: 9 mg/dL (ref 6–20)
CO2: 24 mmol/L (ref 22–32)
Calcium: 9.2 mg/dL (ref 8.9–10.3)
Chloride: 106 mmol/L (ref 98–111)
Creatinine, Ser: 0.93 mg/dL (ref 0.61–1.24)
GFR, Estimated: >60 mL/min (ref >60)
Glucose, Bld: 109 mg/dL — ABNORMAL HIGH (ref 70–99)
Potassium: 3.8 mmol/L (ref 3.5–5.1)
Sodium: 140 mmol/L (ref 135–145)
Total Bilirubin: 0.6 mg/dL (ref 0.0–1.2)
Total Protein: 8 g/dL (ref 6.5–8.1)

## 2024-01-02 LAB — LIPASE, BLOOD: Lipase: 25 U/L (ref 11–51)

## 2024-01-02 MED ORDER — SODIUM CHLORIDE 0.9 % IV BOLUS
1000.0000 mL | Freq: Once | INTRAVENOUS | Status: AC
  Administered 2024-01-02: 1000 mL via INTRAVENOUS

## 2024-01-02 MED ORDER — METHOCARBAMOL 750 MG PO TABS
750.0000 mg | ORAL_TABLET | Freq: Three times a day (TID) | ORAL | 0 refills | Status: DC
  Filled 2024-01-02: qty 21, 7d supply, fill #0

## 2024-01-02 MED ORDER — ONDANSETRON HCL 4 MG/2ML IJ SOLN
4.0000 mg | Freq: Once | INTRAMUSCULAR | Status: AC
  Administered 2024-01-02: 4 mg via INTRAVENOUS
  Filled 2024-01-02: qty 2

## 2024-01-02 MED ORDER — NAPROXEN 500 MG PO TABS
500.0000 mg | ORAL_TABLET | Freq: Two times a day (BID) | ORAL | 0 refills | Status: DC
  Filled 2024-01-02: qty 20, 10d supply, fill #0

## 2024-01-02 MED ORDER — KETOROLAC TROMETHAMINE 15 MG/ML IJ SOLN
15.0000 mg | Freq: Once | INTRAMUSCULAR | Status: AC
  Administered 2024-01-02: 15 mg via INTRAVENOUS
  Filled 2024-01-02: qty 1

## 2024-01-02 MED ORDER — MORPHINE SULFATE (PF) 4 MG/ML IV SOLN
4.0000 mg | Freq: Once | INTRAVENOUS | Status: AC
  Administered 2024-01-02: 4 mg via INTRAVENOUS
  Filled 2024-01-02: qty 1

## 2024-01-02 NOTE — ED Notes (Signed)
 Patient transported to X-ray

## 2024-01-02 NOTE — ED Triage Notes (Signed)
 Pt here with reports of R sided flank pain, sudden onset. Endorses emesis X1. Denies urinary symptoms.

## 2024-01-02 NOTE — Discharge Instructions (Signed)
 Please follow-up closely with your primary care doctor on an outpatient basis.  Return to emergency department immediately for any new or worsening symptoms.

## 2024-01-02 NOTE — ED Notes (Signed)
 Patient transported to CT

## 2024-01-02 NOTE — ED Provider Notes (Signed)
 [place] [facility] Provider Note   CSN: [id number] Arrival date & time: 01/02/24  1735     History  Chief Complaint  Patient presents with   Flank Pain    [name] is a 28 y.o. male.  Patient is a 28 year old male who presents emergency department the chief complaint of right-sided flank pain which has been ongoing for months but notes that it became worse today.  He denies any recent falls or blunt trauma.  He has had no associated dysuria or hematuria.  He does admit to associated nausea and vomiting but denies any associated diarrhea.  He denies any associated urinary bowel incontinence, saddle paresthesias, gait changes or fever or chills.  He has had no associated chest pain or shortness of breath.   Flank Pain       Home Medications Prior to Admission medications   Medication Sig Start Date End Date Taking? Authorizing Provider  methocarbamol (ROBAXIN-750) 750 MG tablet Take 1 tablet (750 mg total) by mouth 3 (three) times daily. 01/02/24  Yes Pearletha Bouche D, PA-C  naproxen  (NAPROSYN ) 500 MG tablet Take 1 tablet (500 mg total) by mouth 2 (two) times daily. 01/02/24  Yes Pearletha Bouche D, PA-C  albuterol  (VENTOLIN  HFA) 108 (90 Base) MCG/ACT inhaler Inhale 2 puffs into the lungs every 6 (six) hours as needed for wheezing or shortness of breath. 12/04/23   Goble Last, MD  amLODipine  (NORVASC ) 5 MG tablet Take 1 tablet (5 mg total) by mouth at bedtime. 12/04/23   Goble Last, MD  amphetamine -dextroamphetamine  (ADDERALL XR) 30 MG 24 hr capsule Take 1 capsule (30 mg total) by mouth every morning. Please make an appointment for further refills 12/04/23   Goble Last, MD  amphetamine -dextroamphetamine  (ADDERALL) 10 MG tablet Take 1 tablet (10 mg total) by mouth daily as needed. Must have visit before next fill 12/04/23   Goble Last, MD  diclofenac  Sodium (VOLTAREN  ARTHRITIS PAIN) 1 % GEL Apply 2 g topically 4 (four) times  daily. 02/08/23   Santa Cuba, MD  fluticasone  (FLONASE ) 50 MCG/ACT nasal spray Place 2 sprays into both nostrils daily. Patient not taking: Reported on 03/07/2022 04/17/21   Paige, Victoria J, DO  Vitamin D , Ergocalciferol , (DRISDOL ) 1.25 MG (50000 UNIT) CAPS capsule Take 1 capsule (50,000 Units total) by mouth every 7 (seven) days. 02/08/23   Santa Cuba, MD  cloNIDine  HCl (KAPVAY ) 0.1 MG TB12 ER tablet Take 1 tablet (0.1 mg total) by mouth at bedtime. 01/07/16 03/14/18  Acie Acosta, FNP  loratadine  (CLARITIN ) 10 MG tablet Take 1 tablet (10 mg total) by mouth daily. 11/10/14 01/27/16  Acie Acosta, FNP  pantoprazole  (PROTONIX ) 40 MG tablet Take 1 tablet (40 mg total) by mouth daily. 10/05/15 01/27/16  Acie Acosta, FNP      Allergies    Patient has no known allergies.    Review of Systems   Review of Systems  Genitourinary:  Positive for flank pain.  All other systems reviewed and are negative.   Physical Exam Updated Vital Signs BP (!) 146/96 (BP Location: Right Arm)   Pulse 89   Temp 97.8 F (36.6 C)   Resp 15   SpO2 98%  Physical Exam Vitals and nursing note reviewed.  Constitutional:      Appearance: Normal appearance.  HENT:     Head: Normocephalic and atraumatic.     Nose: Nose normal.     Mouth/Throat:     Mouth: Mucous  membranes are moist.  Eyes:     Extraocular Movements: Extraocular movements intact.     Conjunctiva/sclera: Conjunctivae normal.     Pupils: Pupils are equal, round, and reactive to light.  Cardiovascular:     Rate and Rhythm: Normal rate and regular rhythm.     Pulses: Normal pulses.     Heart sounds: Normal heart sounds. No murmur heard.    No gallop.  Pulmonary:     Effort: Pulmonary effort is normal. No respiratory distress.     Breath sounds: Normal breath sounds. No stridor. No wheezing, rhonchi or rales.  Abdominal:     General: Abdomen is flat. Bowel sounds are normal. There is no distension.     Palpations: Abdomen is  soft.     Tenderness: There is no guarding.     Comments: Mild tenderness along the right flank, no right upper quadrant tenderness, negative Murphy sign, no McBurney's point tenderness  Musculoskeletal:        General: No tenderness, deformity or signs of injury. Normal range of motion.     Cervical back: Normal range of motion and neck supple.     Right lower leg: No edema.     Left lower leg: No edema.  Skin:    General: Skin is warm and dry.     Findings: No bruising or rash.  Neurological:     General: No focal deficit present.     Mental Status: He is alert and oriented to person, place, and time. Mental status is at baseline.     Cranial Nerves: No cranial nerve deficit.     Sensory: No sensory deficit.     Motor: No weakness.     Coordination: Coordination normal.     Gait: Gait normal.  Psychiatric:        Mood and Affect: Mood normal.        Behavior: Behavior normal.        Thought Content: Thought content normal.        Judgment: Judgment normal.     ED Results / Procedures / Treatments   Labs (all labs ordered are listed, but only abnormal results are displayed) Labs Reviewed  COMPREHENSIVE METABOLIC PANEL WITH GFR - Abnormal; Notable for the following components:      Result Value   Glucose, Bld 109 (*)    All other components within normal limits  URINALYSIS, ROUTINE W REFLEX MICROSCOPIC - Abnormal; Notable for the following components:   APPearance HAZY (*)    Specific Gravity, Urine 1.032 (*)    Protein, ur 30 (*)    Bacteria, UA RARE (*)    All other components within normal limits  LIPASE, BLOOD  CBC    EKG None  Radiology CT Renal Stone Study Result Date: 01/02/2024 CLINICAL DATA:  Flank pain. EXAM: CT ABDOMEN AND PELVIS WITHOUT CONTRAST TECHNIQUE: Multidetector CT imaging of the abdomen and pelvis was performed following the standard protocol without IV contrast. RADIATION DOSE REDUCTION: This exam was performed according to the departmental  dose-optimization program which includes automated exposure control, adjustment of the mA and/or kV according to patient size and/or use of iterative reconstruction technique. COMPARISON:  None Available. FINDINGS: Lower chest: No acute abnormality. Hepatobiliary: No focal liver abnormality is seen. No gallstones, gallbladder wall thickening, or biliary dilatation. Pancreas: Unremarkable. No pancreatic ductal dilatation or surrounding inflammatory changes. Spleen: Normal in size without focal abnormality. Adrenals/Urinary Tract: Adrenal glands are unremarkable. Kidneys are normal, without renal calculi, focal lesion,  or hydronephrosis. Bladder is unremarkable. Stomach/Bowel: Stomach is within normal limits. Appendix appears normal. No evidence of bowel wall thickening, distention, or inflammatory changes. Vascular/Lymphatic: No significant vascular findings are present. No enlarged abdominal or pelvic lymph nodes. Reproductive: Prostate is unremarkable. Other: No abdominal wall hernia or abnormality. No abdominopelvic ascites. Musculoskeletal: No acute or significant osseous findings. IMPRESSION: No acute or active process within the abdomen or pelvis. Electronically Signed   By: Virgle Grime M.D.   On: 01/02/2024 19:19   DG Chest 1 View Result Date: 01/02/2024 CLINICAL DATA:  [id number] Chest pain [id number] EXAM: CHEST  1 VIEW COMPARISON:  Chest x-ray 11/08/2007 FINDINGS: The heart and mediastinal contours are unchanged. Low lung volumes. No focal consolidation. No pulmonary edema. No pleural effusion. No pneumothorax. No acute osseous abnormality. IMPRESSION: Low lung volumes with no active disease. Electronically Signed   By: Morgane  Naveau M.D.   On: 01/02/2024 19:13    Procedures Procedures    Medications Ordered in ED Medications  morphine (PF) 4 MG/ML injection 4 mg (4 mg Intravenous Given 01/02/24 1847)  ondansetron  (ZOFRAN ) injection 4 mg (4 mg Intravenous Given 01/02/24 1847)  ketorolac   (TORADOL ) 15 MG/ML injection 15 mg (15 mg Intravenous Given 01/02/24 1848)  sodium chloride  0.9 % bolus 1,000 mL (1,000 mLs Intravenous New Bag/Given 01/02/24 1846)    ED Course/ Medical Decision Making/ A&P                                 Medical Decision Making Amount and/or Complexity of Data Reviewed Labs: ordered. Radiology: ordered.  Risk Prescription drug management.   This patient presents to the ED for concern of right flank pain differential diagnosis includes acute appendicitis, cholecystitis, pyelonephritis, kidney stone, small bowel obstruction, diverticulitis, testicular torsion, pneumothorax, hemothorax, musculoskeletal pain    Additional history obtained:  Additional history obtained from none External records from outside source obtained and reviewed including none   Lab Tests:  I Ordered, and personally interpreted labs.  The pertinent results include: No leukocytosis, no anemia, normal kidney function liver function, normal electrolytes, unremarkable urinalysis   Imaging Studies ordered:  I ordered imaging studies including CT scan of the abdomen and pelvis, chest x-ray I independently visualized and interpreted imaging which showed no acute surgical process of the abdomen pelvis, no acute cardiopulmonary process I agree with the radiologist interpretation   Medicines ordered and prescription drug management:  I ordered medication including morphine, Toradol , Zofran  for flank pain Reevaluation of the patient after these medicines showed that the patient improved I have reviewed the patients home medicines and have made adjustments as needed   Problem List / ED Course:  Patient is feeling much better at this time and is stable for discharge home.  Pain has resolved with treatment in the emergency department.  Discussed with patient that workup has been overall unremarkable.  Do not suspect acute appendicitis, cholecystitis, bowel suction,  diverticulitis, testicular torsion, pyelonephritis, kidney stone, pancreatitis, mesenteric ischemia.  Suspect musculoskeletal pain at this time and will treat accordingly on an outpatient basis.  Do not suspect any further advanced imaging is warranted.  He had a negative Murphy sign and no McBurney's point tenderness.  Discussed with patient importance of continued close follow-up with his primary care doctor on outpatient basis as well as strict turn precautions for any new or worsening symptoms.  Patient voiced understanding to the plan and had no additional questions.  Social Determinants of Health:  None           Final Clinical Impression(s) / ED Diagnoses Final diagnoses:  Right flank pain    Rx / DC Orders ED Discharge Orders          Ordered    naproxen  (NAPROSYN ) 500 MG tablet  2 times daily        01/02/24 2001    methocarbamol (ROBAXIN-750) 750 MG tablet  3 times daily        01/02/24 2001              Emmalene Hare 01/02/24 Beacher Limerick, MD 01/04/24 2053

## 2024-01-03 ENCOUNTER — Other Ambulatory Visit (HOSPITAL_COMMUNITY): Payer: Self-pay

## 2024-01-07 ENCOUNTER — Other Ambulatory Visit: Payer: Self-pay

## 2024-01-07 ENCOUNTER — Emergency Department (HOSPITAL_COMMUNITY)

## 2024-01-07 ENCOUNTER — Encounter (HOSPITAL_COMMUNITY): Payer: Self-pay

## 2024-01-07 ENCOUNTER — Emergency Department (HOSPITAL_COMMUNITY)
Admission: EM | Admit: 2024-01-07 | Discharge: 2024-01-07 | Disposition: A | Discharge status: Home / Self Care | Attending: Emergency Medicine | Admitting: Emergency Medicine

## 2024-01-07 DIAGNOSIS — K8020 Calculus of gallbladder without cholecystitis without obstruction: Secondary | ICD-10-CM | POA: Insufficient documentation

## 2024-01-07 DIAGNOSIS — M62838 Other muscle spasm: Secondary | ICD-10-CM | POA: Insufficient documentation

## 2024-01-07 DIAGNOSIS — R079 Chest pain, unspecified: Secondary | ICD-10-CM | POA: Diagnosis not present

## 2024-01-07 DIAGNOSIS — R109 Unspecified abdominal pain: Secondary | ICD-10-CM

## 2024-01-07 DIAGNOSIS — R1013 Epigastric pain: Secondary | ICD-10-CM | POA: Diagnosis not present

## 2024-01-07 DIAGNOSIS — R590 Localized enlarged lymph nodes: Secondary | ICD-10-CM | POA: Diagnosis not present

## 2024-01-07 DIAGNOSIS — R160 Hepatomegaly, not elsewhere classified: Secondary | ICD-10-CM | POA: Diagnosis not present

## 2024-01-07 LAB — COMPREHENSIVE METABOLIC PANEL WITH GFR
ALT: 33 U/L (ref 0–44)
AST: 23 U/L (ref 15–41)
Albumin: 3.5 g/dL (ref 3.5–5.0)
Alkaline Phosphatase: 54 U/L (ref 38–126)
Anion gap: 10 (ref 5–15)
BUN: 11 mg/dL (ref 6–20)
CO2: 23 mmol/L (ref 22–32)
Calcium: 8.9 mg/dL (ref 8.9–10.3)
Chloride: 108 mmol/L (ref 98–111)
Creatinine, Ser: 0.97 mg/dL (ref 0.61–1.24)
GFR, Estimated: >60 mL/min (ref >60)
Glucose, Bld: 139 mg/dL — ABNORMAL HIGH (ref 70–99)
Potassium: 3.8 mmol/L (ref 3.5–5.1)
Sodium: 141 mmol/L (ref 135–145)
Total Bilirubin: 0.4 mg/dL (ref 0.0–1.2)
Total Protein: 7 g/dL (ref 6.5–8.1)

## 2024-01-07 LAB — I-STAT CG4 LACTIC ACID, ED
Lactic Acid, Venous: 1.6 mmol/L (ref 0.5–1.9)
Lactic Acid, Venous: 0.9 mmol/L (ref 0.5–1.9)

## 2024-01-07 LAB — CBC WITH DIFFERENTIAL/PLATELET
Abs Immature Granulocytes: 0.03 10*3/uL (ref 0.00–0.07)
Basophils Absolute: 0.1 10*3/uL (ref 0.0–0.1)
Basophils Relative: 1 %
Eosinophils Absolute: 0.3 10*3/uL (ref 0.0–0.5)
Eosinophils Relative: 4 %
HCT: 42.4 % (ref 39.0–52.0)
Hemoglobin: 13.9 g/dL (ref 13.0–17.0)
Immature Granulocytes: 0 %
Lymphocytes Relative: 29 %
Lymphs Abs: 2.1 10*3/uL (ref 0.7–4.0)
MCH: 30 pg (ref 26.0–34.0)
MCHC: 32.8 g/dL (ref 30.0–36.0)
MCV: 91.6 fL (ref 80.0–100.0)
Monocytes Absolute: 0.7 10*3/uL (ref 0.1–1.0)
Monocytes Relative: 10 %
Neutro Abs: 4.1 10*3/uL (ref 1.7–7.7)
Neutrophils Relative %: 56 %
Platelets: 225 10*3/uL (ref 150–400)
RBC: 4.63 MIL/uL (ref 4.22–5.81)
RDW: 14.6 % (ref 11.5–15.5)
WBC: 7.3 10*3/uL (ref 4.0–10.5)
nRBC: 0 % (ref 0.0–0.2)

## 2024-01-07 LAB — URINALYSIS, W/ REFLEX TO CULTURE (INFECTION SUSPECTED)
Bacteria, UA: NONE SEEN
Bilirubin Urine: NEGATIVE
Glucose, UA: NEGATIVE mg/dL
Hgb urine dipstick: NEGATIVE
Ketones, ur: NEGATIVE mg/dL
Leukocytes,Ua: NEGATIVE
Nitrite: NEGATIVE
Protein, ur: NEGATIVE mg/dL
Specific Gravity, Urine: 1.032 — ABNORMAL HIGH (ref 1.005–1.030)
pH: 5 (ref 5.0–8.0)

## 2024-01-07 LAB — LIPASE, BLOOD: Lipase: 31 U/L (ref 11–51)

## 2024-01-07 LAB — CK: Total CK: 191 U/L (ref 49–397)

## 2024-01-07 LAB — TROPONIN I (HIGH SENSITIVITY)
Troponin I (High Sensitivity): 3 ng/L (ref <18)
Troponin I (High Sensitivity): 4 ng/L (ref <18)

## 2024-01-07 MED ORDER — IOHEXOL 350 MG/ML SOLN
75.0000 mL | Freq: Once | INTRAVENOUS | Status: AC | PRN
  Administered 2024-01-07: 75 mL via INTRAVENOUS

## 2024-01-07 MED ORDER — LIDOCAINE 5 % EX PTCH: 1.0000 | MEDICATED_PATCH | CUTANEOUS | 0 refills | Status: DC

## 2024-01-07 MED ORDER — CYCLOBENZAPRINE HCL 10 MG PO TABS: 10.0000 mg | ORAL_TABLET | Freq: Two times a day (BID) | ORAL | 0 refills | Status: AC | PRN

## 2024-01-07 MED ORDER — OXYCODONE-ACETAMINOPHEN 5-325 MG PO TABS: 1.0000 | ORAL_TABLET | ORAL | 0 refills | Status: DC | PRN

## 2024-01-07 MED ORDER — MORPHINE SULFATE (PF) 4 MG/ML IV SOLN
4.0000 mg | Freq: Once | INTRAVENOUS | Status: AC
  Administered 2024-01-07: 4 mg via INTRAVENOUS
  Filled 2024-01-07: qty 1

## 2024-01-07 NOTE — ED Triage Notes (Addendum)
 Pt came in via POV d/t Rt side pain along his torso that has worsened the last 2 days. States he was seen here recently & was told it was a pinched nerve. Has a PCP appointment in 4 days & endorses not being able to wait that long. Rates his pain 8.5/10.

## 2024-01-07 NOTE — Discharge Instructions (Signed)
 Your history, exam, workup today did not show evidence of blood clot in your lungs, cardiac, lung, or other intra-abdominal cause of your pains.  You did have some gallstones although did not show acute cholecystitis, you could be having pains related to your gallbladder.  We recommend follow-up with general surgery if this continues for outpatient evaluation.  Your labs are otherwise reassuring and we feel you are safe for discharge home.  As well suspected could be musculoskeletal with spasm, please use the prescription for pain medicine, muscle medicine, and the numbing patches to up with symptoms.  Please rest and stay hydrated.  If any symptoms change or worsen acutely, please turn to the nearest emergency department.

## 2024-01-07 NOTE — ED Notes (Signed)
 Pt transported to ultrasound.

## 2024-01-07 NOTE — ED Provider Notes (Signed)
 [place] EMERGENCY DEPARTMENT AT [facility] Provider Note   CSN: [id number] Arrival date & time: 01/07/24  1601     History  Chief Complaint  Patient presents with   Rt side pain    [name] is a 28 y.o. male.  The history is provided by the patient and medical records. No language interpreter was used.  Chest Pain Pain location:  R chest and R lateral chest Pain radiates to:  Epigastrium Pain severity:  Severe Onset quality:  Gradual Duration:  5 days Timing:  Constant Progression:  Waxing and waning Chronicity:  New Context: at rest   Context: not trauma   Relieved by:  Nothing Worsened by:  Nothing Ineffective treatments:  None tried Associated symptoms: abdominal pain and back pain   Associated symptoms: no altered mental status, no anxiety, no claudication, no cough, no dizziness, no dysphagia, no fatigue, no fever, no headache, no nausea, no palpitations, no shortness of breath, no vomiting and no weakness        Home Medications Prior to Admission medications   Medication Sig Start Date End Date Taking? Authorizing Provider  albuterol  (VENTOLIN  HFA) 108 (90 Base) MCG/ACT inhaler Inhale 2 puffs into the lungs every 6 (six) hours as needed for wheezing or shortness of breath. 12/04/23   Goble Last, MD  amLODipine  (NORVASC ) 5 MG tablet Take 1 tablet (5 mg total) by mouth at bedtime. 12/04/23   Goble Last, MD  amphetamine -dextroamphetamine  (ADDERALL XR) 30 MG 24 hr capsule Take 1 capsule (30 mg total) by mouth every morning. Please make an appointment for further refills 12/04/23   Goble Last, MD  amphetamine -dextroamphetamine  (ADDERALL) 10 MG tablet Take 1 tablet (10 mg total) by mouth daily as needed. Must have visit before next fill 12/04/23   Goble Last, MD  diclofenac  Sodium (VOLTAREN  ARTHRITIS PAIN) 1 % GEL Apply 2 g topically 4 (four) times daily. 02/08/23   Santa Cuba, MD  fluticasone  (FLONASE ) 50 MCG/ACT nasal spray Place 2  sprays into both nostrils daily. Patient not taking: Reported on 03/07/2022 04/17/21   Paige, Victoria J, DO  methocarbamol  (ROBAXIN -750) 750 MG tablet Take 1 tablet (750 mg total) by mouth 3 (three) times daily. 01/02/24   Roselynn Connors, PA-C  naproxen  (NAPROSYN ) 500 MG tablet Take 1 tablet (500 mg total) by mouth 2 (two) times daily. 01/02/24   Pearletha Bouche D, PA-C  Vitamin D , Ergocalciferol , (DRISDOL ) 1.25 MG (50000 UNIT) CAPS capsule Take 1 capsule (50,000 Units total) by mouth every 7 (seven) days. 02/08/23   Santa Cuba, MD  cloNIDine  HCl (KAPVAY ) 0.1 MG TB12 ER tablet Take 1 tablet (0.1 mg total) by mouth at bedtime. 01/07/16 03/14/18  Acie Acosta, FNP  loratadine  (CLARITIN ) 10 MG tablet Take 1 tablet (10 mg total) by mouth daily. 11/10/14 01/27/16  Acie Acosta, FNP  pantoprazole  (PROTONIX ) 40 MG tablet Take 1 tablet (40 mg total) by mouth daily. 10/05/15 01/27/16  Acie Acosta, FNP      Allergies    Patient has no known allergies.    Review of Systems   Review of Systems  Constitutional:  Negative for chills, fatigue and fever.  HENT:  Negative for congestion and trouble swallowing.   Respiratory:  Negative for cough, chest tightness, shortness of breath and wheezing.   Cardiovascular:  Positive for chest pain. Negative for palpitations, claudication and leg swelling.  Gastrointestinal:  Positive for abdominal pain. Negative for constipation, diarrhea, nausea and vomiting.  Genitourinary:  Negative for dysuria.  Musculoskeletal:  Positive for back pain. Negative for neck pain and neck stiffness.  Skin:  Negative for rash and wound.  Neurological:  Negative for dizziness, weakness and headaches.  Psychiatric/Behavioral:  Negative for agitation and confusion.   All other systems reviewed and are negative.   Physical Exam Updated Vital Signs BP (!) 155/95   Pulse 95   Temp 97.9 F (36.6 C)   Resp 16   Ht 5\' 11"  (1.803 m)   Wt (!) 158.8 kg   SpO2 97%    BMI 48.82 kg/m  Physical Exam Vitals and nursing note reviewed.  Constitutional:      General: He is not in acute distress.    Appearance: He is well-developed. He is not ill-appearing, toxic-appearing or diaphoretic.  HENT:     Head: Normocephalic and atraumatic.     Nose: No congestion or rhinorrhea.     Mouth/Throat:     Pharynx: No oropharyngeal exudate or posterior oropharyngeal erythema.  Eyes:     Extraocular Movements: Extraocular movements intact.     Conjunctiva/sclera: Conjunctivae normal.     Pupils: Pupils are equal, round, and reactive to light.  Cardiovascular:     Rate and Rhythm: Normal rate and regular rhythm.     Heart sounds: No murmur heard. Pulmonary:     Effort: Pulmonary effort is normal. No respiratory distress.     Breath sounds: No wheezing, rhonchi or rales.  Chest:     Chest wall: No tenderness.  Abdominal:     General: Abdomen is flat.     Palpations: Abdomen is soft.     Tenderness: There is abdominal tenderness. There is right CVA tenderness. There is no left CVA tenderness, guarding or rebound.  Musculoskeletal:        General: Tenderness present. No swelling.     Cervical back: Neck supple. No tenderness.  Skin:    General: Skin is warm and dry.     Capillary Refill: Capillary refill takes less than 2 seconds.     Findings: No erythema or rash.  Neurological:     General: No focal deficit present.     Mental Status: He is alert.     Sensory: No sensory deficit.     Motor: No weakness.  Psychiatric:        Mood and Affect: Mood normal.     ED Results / Procedures / Treatments   Labs (all labs ordered are listed, but only abnormal results are displayed) Labs Reviewed  COMPREHENSIVE METABOLIC PANEL WITH GFR - Abnormal; Notable for the following components:      Result Value   Glucose, Bld 139 (*)    All other components within normal limits  URINALYSIS, W/ REFLEX TO CULTURE (INFECTION SUSPECTED) - Abnormal; Notable for the  following components:   APPearance HAZY (*)    Specific Gravity, Urine 1.032 (*)    All other components within normal limits  CBC WITH DIFFERENTIAL/PLATELET  LIPASE, BLOOD  CK  I-STAT CG4 LACTIC ACID, ED  I-STAT CG4 LACTIC ACID, ED  TROPONIN I (HIGH SENSITIVITY)  TROPONIN I (HIGH SENSITIVITY)    EKG EKG Interpretation Date/Time:  Sunday Jan 07 2024 09:39:16 EDT Ventricular Rate:  71 PR Interval:  171 QRS Duration:  100 QT Interval:  395 QTC Calculation: 430 R Axis:   87  Text Interpretation: Sinus arrhythmia no prior ECG for comparison No STEMI Confirmed by Wynell Heath ([id number]) on 01/07/2024 12:26:11 PM  Radiology CT Angio Chest  PE W and/or Wo Contrast Result Date: 01/07/2024 CLINICAL DATA:  Pulmonary embolism (PE) suspected, high prob. Right upper quadrant abdominal pain. Right side pain. EXAM: CT ANGIOGRAPHY CHEST WITH CONTRAST TECHNIQUE: Multidetector CT imaging of the chest was performed using the standard protocol during bolus administration of intravenous contrast. Multiplanar CT image reconstructions and MIPs were obtained to evaluate the vascular anatomy. RADIATION DOSE REDUCTION: This exam was performed according to the departmental dose-optimization program which includes automated exposure control, adjustment of the mA and/or kV according to patient size and/or use of iterative reconstruction technique. CONTRAST:  75mL OMNIPAQUE IOHEXOL 350 MG/ML SOLN COMPARISON:  Ultrasound abdomen 01/07/2024, chest x-ray 01/02/2024 FINDINGS: Cardiovascular: Satisfactory opacification of the pulmonary arteries to the segmental level. No evidence of pulmonary embolism. Normal heart size. No significant pericardial effusion. The thoracic aorta is normal in caliber. No atherosclerotic plaque of the thoracic aorta. No coronary artery calcifications. Mediastinum/Nodes: Right hilar enlarged lymph node measuring 1.7 cm. No enlarged mediastinal, left hilar, or axillary lymph nodes. Thyroid gland,  trachea, and esophagus demonstrate no significant findings. Lungs/Pleura: Expiratory phase of respiration. No focal consolidation. No pulmonary nodule. No pulmonary mass. No pleural effusion. No pneumothorax. Upper Abdomen: Hepatomegaly. Musculoskeletal: No chest wall abnormality. No suspicious lytic or blastic osseous lesions. No acute displaced fracture. Multilevel degenerative changes of the spine. Review of the MIP images confirms the above findings. IMPRESSION: 1. No pulmonary embolus. 2. Indeterminate right hilar lymphadenopathy. Recommend attention on follow-up. 3. Hepatomegaly. Electronically Signed   By: Morgane  Naveau M.D.   On: 01/07/2024 12:14   US  Abdomen Limited RUQ (LIVER/GB) Result Date: 01/07/2024 CLINICAL DATA:  28 year old male with right upper quadrant abdominal pain. EXAM: ULTRASOUND ABDOMEN LIMITED RIGHT UPPER QUADRANT COMPARISON:  CT Abdomen and Pelvis 01/02/2024. FINDINGS: Gallbladder: Shadowing gallstones individually estimated at 18 mm diameter. Gallbladder wall thickness at the upper limits of normal. No sonographic Murphy sign elicited. No pericholecystic fluid. Common bile duct: Diameter: 3 mm, normal. Liver: Echogenic liver, difficult to penetrate. No discrete liver lesion. Portal vein is patent on color Doppler imaging with normal direction of blood flow towards the liver. Other: No free fluid in the right upper quadrant. IMPRESSION: 1. Cholelithiasis without strong evidence of acute cholecystitis. 2. No evidence of bile duct obstruction. 3. Echogenic liver most commonly due to Steatosis. Electronically Signed   By: Marlise Simpers M.D.   On: 01/07/2024 09:38    Procedures Procedures    Medications Ordered in ED Medications  morphine  (PF) 4 MG/ML injection 4 mg (4 mg Intravenous Given 01/07/24 0914)  iohexol (OMNIPAQUE) 350 MG/ML injection 75 mL (75 mLs Intravenous Contrast Given 01/07/24 1205)    ED Course/ Medical Decision Making/ A&P                                  Medical Decision Making Amount and/or Complexity of Data Reviewed Labs: ordered. Radiology: ordered.  Risk Prescription drug management.    HUDSYN CHAMPINE is a 28 y.o. male with a past medical history significant for asthma, ADHD, hyperlipidemia, and obesity who presents for persistent right sided pain.  According patient, he was seen several days ago and was told it was likely muscle skeletal pain.  He had workup look for kidney stone that did not show evidence of stone he was able go home.  He is scheduled see a PCP on Thursday however the pain is too severe.  He reports that his extremely  severe and primarily in his right lower chest and right upper abdomen and right side.  He reports no nausea or vomiting and has no fevers or chills.  He reports no trauma.  Denies skin changes to suggest shingles.  He denies any dysuria hematuria or other urinary changes.  Denies constipation or diarrhea.  He simply has all of this pain.  He reports he did eat some fried chicken yesterday but does not think that made the pain worsened today.  He describes as sharp and it is very hard to take a deep breath with the pain.  He denies history of blood clots and denies any family history of blood clots.  He denies any leg pain or leg swelling.  On exam, lungs were clear and chest was not focally tender.  He did have tenderness in his right upper quadrant and had pleuritic pain in his right chest with a deep breath.  Right side was also tender and I did not see any evidence of shingles or rash.  Back midline was not tender but he did have some right CVA tenderness slightly.  Otherwise exam reassuring.  We will give some pain medicine to him as we start this workup.  Given his persistent symptoms that are worsening, we will get a right quadrant ultrasound to look for cholecystitis that may have been missed on the Noncon CT.  Will also get a CT PE study given my concern for possible pulmonary embolism given his  pleuritic discomfort that is extremely sharp and his heart rate was around 100 during my initial evaluation.  Will check urinalysis given the flank pain to make sure is not occult UTI although with his lack of stones and low suspicion for kidney stone.  If workup reassuring, suspect this is muscle skeletal pain and we will discuss how to escalate his pain regimen till he can see his PCP however we will get the CT, ultrasound, and labs initially.  Patient and family agree with plan of care.  Anticipate reassessment after workup to determine disposition.  Workup returned showing gallstones but no convincing acute cholecystitis.  CT scan did not show pulmonary embolism or pneumothorax or other acute abnormality otherwise.  We went over the findings with patient and he will follow-up with general surgery.  He may have symptomatic lithiasis versus muscle skeletal spasm and pain so we will give prescription for pain medicine and muscle medicine and Lidoderm  patches.  Patient family agreed to plan of care and patient we discharged for outpatient follow-up.         Final Clinical Impression(s) / ED Diagnoses Final diagnoses:  Right sided abdominal pain  Gallstones  Muscle spasm    Rx / DC Orders ED Discharge Orders          Ordered    oxyCODONE-acetaminophen  (PERCOCET/ROXICET) 5-325 MG tablet  Every 4 hours PRN        01/07/24 1532    cyclobenzaprine (FLEXERIL) 10 MG tablet  2 times daily PRN        01/07/24 1532    lidocaine  (LIDODERM ) 5 %  Every 24 hours        01/07/24 1532            Clinical Impression: 1. Right sided abdominal pain   2. Gallstones   3. Muscle spasm     Disposition: Discharge  Condition: Good  I have discussed the results, Dx and Tx plan with the pt(& family if present). He/she/they expressed understanding and agree(s)  with the plan. Discharge instructions discussed at great length. Strict return precautions discussed and pt &/or family have  verbalized understanding of the instructions. No further questions at time of discharge.    New Prescriptions   CYCLOBENZAPRINE (FLEXERIL) 10 MG TABLET    Take 1 tablet (10 mg total) by mouth 2 (two) times daily as needed for muscle spasms.   LIDOCAINE  (LIDODERM ) 5 %    Place 1 patch onto the skin daily. Remove & Discard patch within 12 hours or as directed by MD   OXYCODONE-ACETAMINOPHEN  (PERCOCET/ROXICET) 5-325 MG TABLET    Take 1 tablet by mouth every 4 (four) hours as needed for severe pain (pain score 7-10).    Follow Up: Surgery, [facility] [address] [place] [address] [phone]   with general surgery for eval of your gallbladder      Myeesha Shane, Marine Sia, MD 01/07/24 [phone]

## 2024-01-15 ENCOUNTER — Encounter: Payer: Self-pay | Admitting: Family Medicine

## 2024-01-16 ENCOUNTER — Encounter: Payer: Self-pay | Admitting: Student

## 2024-01-16 ENCOUNTER — Ambulatory Visit (INDEPENDENT_AMBULATORY_CARE_PROVIDER_SITE_OTHER): Admitting: Student

## 2024-01-16 VITALS — BP 136/72 | HR 87 | Ht 71.0 in | Wt 369.4 lb

## 2024-01-16 DIAGNOSIS — K8050 Calculus of bile duct without cholangitis or cholecystitis without obstruction: Secondary | ICD-10-CM

## 2024-01-16 MED ORDER — OXYCODONE-ACETAMINOPHEN 5-325 MG PO TABS: 1.0000 | ORAL_TABLET | ORAL | 0 refills | Status: DC | PRN

## 2024-01-16 NOTE — Patient Instructions (Addendum)
 Pride, I am sending in another small supply of oxycodone -acetaminophen  for your pain. Be sure that you are not taking other acetaminophen  (tylenol ) containing drugs with this. This is just meant to hold you over until surgery can take care of the underlying problem.   Surgery, [facility] [place] [address] [place] [address] [phone]  Alexa Andrews, MD

## 2024-01-17 ENCOUNTER — Other Ambulatory Visit: Payer: Self-pay | Admitting: Student

## 2024-01-17 DIAGNOSIS — E6609 Other obesity due to excess calories: Secondary | ICD-10-CM

## 2024-01-17 NOTE — Progress Notes (Signed)
    SUBJECTIVE:   CHIEF COMPLAINT / HPI:   Biliary Colic Ongoing issue for quite some time now, does seem to be getting worse. Was seen in the ED 9 days ago and diagnosed with cholelithiasis without acute cholecystitis. Was given a supply of 15 tablets of oxycodone -acetaminophen  5-325mg  to help manage pain. This has been helpful and he is requesting another short supply to carry him through to surgery. He has connected with surgery by phone, but they haven't actually scheduled this as of yet.    OBJECTIVE:   BP 136/72   Pulse 87   Ht 5\' 11"  (1.803 m)   Wt (!) 369 lb 6.4 oz (167.6 kg)   SpO2 94%   BMI 51.52 kg/m   Gen: In good spirits and NAD Abd: Obese, soft, non-tender RUQ   ASSESSMENT/PLAN:   Assessment & Plan Biliary colic 2/2 known cholelithiasis. Referred to [facility] by EDP. They have connected by phone, no surgery or appt date yet. - Refill Percocet, 20 tablet supply. He has used 15 tablets in 9 days, suggesting appropriate use with low suspicion for abuse or diversion. - Gave him the number to [facility] to expedite process of getting in with them and scheduled for surgery      J Lark Plum, MD [facility]

## 2024-01-17 NOTE — Progress Notes (Signed)
 Reordered sleep study to [facility]

## 2024-01-22 ENCOUNTER — Encounter: Payer: Self-pay | Admitting: Family Medicine

## 2024-01-23 ENCOUNTER — Other Ambulatory Visit: Payer: Self-pay | Admitting: Student

## 2024-01-23 DIAGNOSIS — F909 Attention-deficit hyperactivity disorder, unspecified type: Secondary | ICD-10-CM

## 2024-01-24 ENCOUNTER — Other Ambulatory Visit (HOSPITAL_COMMUNITY): Payer: Self-pay

## 2024-01-24 MED ORDER — AMPHETAMINE-DEXTROAMPHETAMINE 10 MG PO TABS
10.0000 mg | ORAL_TABLET | Freq: Every day | ORAL | 0 refills | Status: DC | PRN
Start: 2024-01-24 — End: 2024-02-22
  Filled 2024-01-24: qty 15, 15d supply, fill #0

## 2024-01-24 MED ORDER — AMPHETAMINE-DEXTROAMPHET ER 30 MG PO CP24
30.0000 mg | ORAL_CAPSULE | ORAL | 0 refills | Status: DC
Start: 2024-01-24 — End: 2024-02-22
  Filled 2024-01-24: qty 30, 30d supply, fill #0

## 2024-01-25 ENCOUNTER — Other Ambulatory Visit (HOSPITAL_COMMUNITY): Payer: Self-pay

## 2024-02-02 ENCOUNTER — Telehealth: Payer: Self-pay

## 2024-02-02 DIAGNOSIS — K8050 Calculus of bile duct without cholangitis or cholecystitis without obstruction: Secondary | ICD-10-CM

## 2024-02-02 NOTE — Telephone Encounter (Signed)
 Patient and family call nurse line requesting a referral.   They report [facility] needs a referral for gallbladder removal.   Advised will forward to provider who saw patient for concern.

## 2024-02-06 NOTE — Addendum Note (Signed)
 Addended by: Darell Echevaria B on: 02/06/2024 10:01 AM   Modules accepted: Orders

## 2024-02-13 DIAGNOSIS — K8020 Calculus of gallbladder without cholecystitis without obstruction: Secondary | ICD-10-CM | POA: Diagnosis not present

## 2024-02-22 ENCOUNTER — Other Ambulatory Visit: Payer: Self-pay | Admitting: Student

## 2024-02-22 DIAGNOSIS — F909 Attention-deficit hyperactivity disorder, unspecified type: Secondary | ICD-10-CM

## 2024-02-23 MED ORDER — AMPHETAMINE-DEXTROAMPHETAMINE 10 MG PO TABS
10.0000 mg | ORAL_TABLET | Freq: Every day | ORAL | 0 refills | Status: DC | PRN
  Filled 2024-02-23: qty 15, 15d supply, fill #0

## 2024-02-23 MED ORDER — AMPHETAMINE-DEXTROAMPHET ER 30 MG PO CP24
30.0000 mg | ORAL_CAPSULE | ORAL | 0 refills | Status: DC
  Filled 2024-02-23: qty 30, 30d supply, fill #0

## 2024-02-26 ENCOUNTER — Other Ambulatory Visit (HOSPITAL_COMMUNITY): Payer: Self-pay

## 2024-03-07 ENCOUNTER — Ambulatory Visit (HOSPITAL_BASED_OUTPATIENT_CLINIC_OR_DEPARTMENT_OTHER): Attending: Family Medicine | Admitting: Internal Medicine

## 2024-03-07 ENCOUNTER — Encounter (HOSPITAL_BASED_OUTPATIENT_CLINIC_OR_DEPARTMENT_OTHER): Payer: Self-pay

## 2024-03-07 VITALS — Ht 71.0 in | Wt 370.0 lb

## 2024-03-07 DIAGNOSIS — Z6841 Body Mass Index (BMI) 40.0 and over, adult: Secondary | ICD-10-CM | POA: Insufficient documentation

## 2024-03-07 DIAGNOSIS — E6609 Other obesity due to excess calories: Secondary | ICD-10-CM | POA: Insufficient documentation

## 2024-03-07 DIAGNOSIS — G4733 Obstructive sleep apnea (adult) (pediatric): Secondary | ICD-10-CM | POA: Diagnosis not present

## 2024-03-07 NOTE — Progress Notes (Signed)
 Surgical Instructions   Your procedure is scheduled on Thursday March 14, 2024. Report to [facility] Main Entrance A at 7:15 A.M., then check in with the Admitting office. Any questions or running late day of surgery: call [phone]  Questions prior to your surgery date: call [phone], Monday-Friday, 8am-4pm. If you experience any cold or flu symptoms such as cough, fever, chills, shortness of breath, etc. between now and your scheduled surgery, please notify us  at the above number.     Remember:  Do not eat or drink after midnight the night before your surgery  Take these medicines the morning of surgery with A SIP OF WATER cyclobenzaprine  (FLEXERIL    May take these medicines IF NEEDED: albuterol  (VENTOLIN  HFA) 108 (90 Base) MCG/ACT inhaler. Please bring with you to the hospital     One week prior to surgery, STOP taking any Aspirin (unless otherwise instructed by your surgeon) Aleve , Naproxen , Ibuprofen , Motrin , Advil , Goody's, BC's, all herbal medications, fish oil, and non-prescription vitamins.                     Do NOT Smoke (Tobacco/Vaping) for 24 hours prior to your procedure.  If you use a CPAP at night, you may bring your mask/headgear for your overnight stay.   You will be asked to remove any contacts, glasses, piercing's, hearing aid's, dentures/partials prior to surgery. Please bring cases for these items if needed.    Patients discharged the day of surgery will not be allowed to drive home, and someone needs to stay with them for 24 hours.  SURGICAL WAITING ROOM VISITATION Patients may have no more than 2 support people in the waiting area - these visitors may rotate.   Pre-op nurse will coordinate an appropriate time for 1 ADULT support person, who may not rotate, to accompany patient in pre-op.  Children under the age of 70 must have an adult with them who is not the patient and must remain in the main waiting area with an adult.  If the patient needs to  stay at the hospital during part of their recovery, the visitor guidelines for inpatient rooms apply.  Please refer to the [facility] website for the visitor guidelines for any additional information.   If you received a COVID test during your pre-op visit  it is requested that you wear a mask when out in public, stay away from anyone that may not be feeling well and notify your surgeon if you develop symptoms. If you have been in contact with anyone that has tested positive in the last 10 days please notify you surgeon.      Pre-operative CHG Bathing Instructions   You can play a key role in reducing the risk of infection after surgery. Your skin needs to be as free of germs as possible. You can reduce the number of germs on your skin by washing with CHG (chlorhexidine gluconate) soap before surgery. CHG is an antiseptic soap that kills germs and continues to kill germs even after washing.   DO NOT use if you have an allergy to chlorhexidine/CHG or antibacterial soaps. If your skin becomes reddened or irritated, stop using the CHG and notify one of our RNs at [phone].              TAKE A SHOWER THE NIGHT BEFORE SURGERY AND THE DAY OF SURGERY    Please keep in mind the following:  You may shave your face before/day of surgery.  Place clean  sheets on your bed the night before surgery Use a clean washcloth (not used since being washed) for each shower. DO NOT sleep with pet's night before surgery.  CHG Shower Instructions:  Wash your face and private area with normal soap. If you choose to wash your hair, wash first with your normal shampoo.  After you use shampoo/soap, rinse your hair and body thoroughly to remove shampoo/soap residue.  Turn the water OFF and apply half the bottle of CHG soap to a CLEAN washcloth.  Apply CHG soap ONLY FROM YOUR NECK DOWN TO YOUR TOES (washing for 3-5 minutes)  DO NOT use CHG soap on face, private areas, open wounds, or sores.  Pay special attention  to the area where your surgery is being performed.  If you are having back surgery, having someone wash your back for you may be helpful. Wait 2 minutes after CHG soap is applied, then you may rinse off the CHG soap.  Pat dry with a clean towel  Put on clean pajamas    Additional instructions for the day of surgery: DO NOT APPLY any lotions, deodorants or cologne   Do not wear jewelry  Do not bring valuables to the hospital. [facility] is not responsible for valuables/personal belongings. Put on clean/comfortable clothes.  Please brush your teeth.  Ask your nurse before applying any prescription medications to the skin.

## 2024-03-08 ENCOUNTER — Encounter (HOSPITAL_COMMUNITY): Payer: Self-pay

## 2024-03-08 ENCOUNTER — Inpatient Hospital Stay (HOSPITAL_COMMUNITY)
Admission: RE | Admit: 2024-03-08 | Discharge: 2024-03-08 | Disposition: A | Discharge status: Home / Self Care | Source: Ambulatory Visit

## 2024-03-08 ENCOUNTER — Ambulatory Visit: Payer: Self-pay | Admitting: General Surgery

## 2024-03-08 NOTE — Progress Notes (Signed)
 PCP - [facility] Cardiologist - none  Chest x-ray - 01/02/24 EKG - 01/07/24 Stress Test - n/a ECHO - n/a Cardiac Cath - n/a  ICD Pacemaker/Loop - n/a  Sleep Study -  Yes on 03/07/24, pt hs not received results yet from study.  Diabetes - n/a  Aspirin & Blood Thinner Instructions:  n/a  NPO  Anesthesia review: Yes  STOP now taking any Aspirin (unless otherwise instructed by your surgeon), Aleve , Naproxen , Ibuprofen , Motrin , Advil , Goody's, BC's, all herbal medications, fish oil, and all vitamins.   Coronavirus Screening Do you have any of the following symptoms:  Cough yes/no: No Fever (>100.50F)  yes/no: No Runny nose yes/no: No Sore throat yes/no: No Difficulty breathing/shortness of breath  yes/no: No  Have you traveled in the last 14 days and where? yes/no: No  Patient verbalized understanding of instructions that were given to them at the PAT appointment. Patient was also instructed that they will need to review over the PAT instructions again at home before surgery.

## 2024-03-08 NOTE — Progress Notes (Signed)
 Patient was a no show for PAT appointment.  Called cell phone and his sister answered.  They did not realize his appointment was today and will have to re-schedule.

## 2024-03-11 ENCOUNTER — Other Ambulatory Visit (HOSPITAL_COMMUNITY): Payer: Self-pay

## 2024-03-11 ENCOUNTER — Encounter (HOSPITAL_COMMUNITY)
Admission: RE | Admit: 2024-03-11 | Discharge: 2024-03-11 | Disposition: A | Discharge status: Home / Self Care | Source: Ambulatory Visit | Attending: General Surgery | Admitting: General Surgery

## 2024-03-11 ENCOUNTER — Other Ambulatory Visit: Payer: Self-pay

## 2024-03-11 ENCOUNTER — Encounter (HOSPITAL_COMMUNITY): Payer: Self-pay

## 2024-03-11 VITALS — BP 125/83 | HR 68 | Temp 98.4°F | Resp 18 | Ht 71.0 in | Wt 362.2 lb

## 2024-03-11 DIAGNOSIS — Z6841 Body Mass Index (BMI) 40.0 and over, adult: Secondary | ICD-10-CM | POA: Insufficient documentation

## 2024-03-11 DIAGNOSIS — F909 Attention-deficit hyperactivity disorder, unspecified type: Secondary | ICD-10-CM | POA: Insufficient documentation

## 2024-03-11 DIAGNOSIS — I10 Essential (primary) hypertension: Secondary | ICD-10-CM | POA: Diagnosis not present

## 2024-03-11 DIAGNOSIS — Z01812 Encounter for preprocedural laboratory examination: Secondary | ICD-10-CM | POA: Insufficient documentation

## 2024-03-11 DIAGNOSIS — R7303 Prediabetes: Secondary | ICD-10-CM | POA: Diagnosis not present

## 2024-03-11 DIAGNOSIS — K8020 Calculus of gallbladder without cholecystitis without obstruction: Secondary | ICD-10-CM | POA: Diagnosis not present

## 2024-03-11 DIAGNOSIS — K760 Fatty (change of) liver, not elsewhere classified: Secondary | ICD-10-CM | POA: Diagnosis not present

## 2024-03-11 DIAGNOSIS — J45909 Unspecified asthma, uncomplicated: Secondary | ICD-10-CM | POA: Diagnosis not present

## 2024-03-11 DIAGNOSIS — Z01818 Encounter for other preprocedural examination: Secondary | ICD-10-CM

## 2024-03-11 HISTORY — DX: Essential (primary) hypertension: I10

## 2024-03-11 HISTORY — DX: Fatty (change of) liver, not elsewhere classified: K76.0

## 2024-03-11 HISTORY — DX: Anxiety disorder, unspecified: F41.9

## 2024-03-11 HISTORY — DX: Prediabetes: R73.03

## 2024-03-11 HISTORY — DX: Other seasonal allergic rhinitis: J30.2

## 2024-03-11 LAB — COMPREHENSIVE METABOLIC PANEL WITH GFR
ALT: 44 U/L (ref 0–44)
AST: 25 U/L (ref 15–41)
Albumin: 3.9 g/dL (ref 3.5–5.0)
Alkaline Phosphatase: 50 U/L (ref 38–126)
Anion gap: 11 (ref 5–15)
BUN: 6 mg/dL (ref 6–20)
CO2: 26 mmol/L (ref 22–32)
Calcium: 9.3 mg/dL (ref 8.9–10.3)
Chloride: 103 mmol/L (ref 98–111)
Creatinine, Ser: 0.88 mg/dL (ref 0.61–1.24)
GFR, Estimated: >60 mL/min (ref >60)
Glucose, Bld: 102 mg/dL — ABNORMAL HIGH (ref 70–99)
Potassium: 3.6 mmol/L (ref 3.5–5.1)
Sodium: 140 mmol/L (ref 135–145)
Total Bilirubin: 1.3 mg/dL — ABNORMAL HIGH (ref 0.0–1.2)
Total Protein: 7.5 g/dL (ref 6.5–8.1)

## 2024-03-11 LAB — CBC
HCT: 47.2 % (ref 39.0–52.0)
Hemoglobin: 15.6 g/dL (ref 13.0–17.0)
MCH: 30.2 pg (ref 26.0–34.0)
MCHC: 33.1 g/dL (ref 30.0–36.0)
MCV: 91.5 fL (ref 80.0–100.0)
Platelets: 223 K/uL (ref 150–400)
RBC: 5.16 MIL/uL (ref 4.22–5.81)
RDW: 14.6 % (ref 11.5–15.5)
WBC: 6.9 K/uL (ref 4.0–10.5)
nRBC: 0 % (ref 0.0–0.2)

## 2024-03-11 LAB — GLUCOSE, CAPILLARY: Glucose-Capillary: 93 mg/dL (ref 70–99)

## 2024-03-12 NOTE — Progress Notes (Signed)
 Anesthesia Chart Review:  Case: [id number] Date/Time: 03/14/24 0900   Procedure: LAPAROSCOPIC CHOLECYSTECTOMY   Anesthesia type: General   Diagnosis: Calculus of gallbladder without cholecystitis without obstruction [K80.20]   Pre-op diagnosis: symptomatic cholelithiasis   [facility]: [facility]OM 02 / [facility]   Surgeons: Ann Fine, MD       DISCUSSION: Patient is a 28 year old male scheduled for the above procedure.  Diagnosis is symptomatic cholelithiasis.  History includes smoking, asthma, HTN, fatty liver, prediabetes, ADHD.  BMI is consistent with morbid obesity.  Anesthesia team to evaluate on the day of surgery.  VS: BP 125/83   Pulse 68   Temp 36.9 C   Resp 18   Ht 5' 11 (1.803 m)   Wt (!) 164.3 kg   SpO2 97%   BMI 50.52 kg/m   PROVIDERS: Nicholas Bar, MD is PCP    LABS: Labs reviewed: Acceptable for surgery.  AST 25, ALT 44.  A1c 5.9% on 12/04/2023. (all labs ordered are listed, but only abnormal results are displayed)  Labs Reviewed  COMPREHENSIVE METABOLIC PANEL WITH GFR - Abnormal; Notable for the following components:      Result Value   Glucose, Bld 102 (*)    Total Bilirubin 1.3 (*)    All other components within normal limits  GLUCOSE, CAPILLARY  CBC     IMAGES: CTA chest 01/07/2024: IMPRESSION: 1. No pulmonary embolus. 2. Indeterminate right hilar lymphadenopathy. Recommend attention on follow-up. 3. Hepatomegaly.  US  Abd (RUQ) 01/07/24: IMPRESSION: 1. Cholelithiasis without strong evidence of acute cholecystitis. 2. No evidence of bile duct obstruction. 3. Echogenic liver most commonly due to Steatosis.    EKG: 01/07/2024: Sinus arrhythmia at 71 bpm   CV: N/A  Past Medical History:  Diagnosis Date   ADHD (attention deficit hyperactivity disorder)    Anxiety    per pt   Asthma    prn inhaler   Fatty liver    per pt   Hypertension    Lateral meniscus tear 02/2017   right   Pre-diabetes    Seasonal allergies     Past  Surgical History:  Procedure Laterality Date   HAND SURGERY Left    as a child   KNEE ARTHROSCOPY WITH LATERAL MENISECTOMY Right 03/22/2017   Procedure: RIGHT KNEE ARTHROSCOPY WITH LATERAL MENISCECTOMY;  Surgeon: Jane Charleston, MD;  Location: [facility];  Service: Orthopedics;  Laterality: Right;    MEDICATIONS:  albuterol  (VENTOLIN  HFA) 108 (90 Base) MCG/ACT inhaler   amLODipine  (NORVASC ) 5 MG tablet   amphetamine -dextroamphetamine  (ADDERALL XR) 30 MG 24 hr capsule   amphetamine -dextroamphetamine  (ADDERALL) 10 MG tablet   cyclobenzaprine  (FLEXERIL ) 10 MG tablet   diclofenac  Sodium (VOLTAREN  ARTHRITIS PAIN) 1 % GEL   fluticasone  (FLONASE ) 50 MCG/ACT nasal spray   lidocaine  (LIDODERM ) 5 %   methocarbamol  (ROBAXIN -750) 750 MG tablet   naproxen  (NAPROSYN ) 500 MG tablet   oxyCODONE -acetaminophen  (PERCOCET/ROXICET) 5-325 MG tablet   Vitamin D , Ergocalciferol , (DRISDOL ) 1.25 MG (50000 UNIT) CAPS capsule   No current facility-administered medications for this encounter.    Isaiah Ruder, PA-C Surgical Short Stay/Anesthesiology [facility] Phone [phone] [facility] Phone [phone] 03/12/2024 3:53 PM

## 2024-03-12 NOTE — Anesthesia Preprocedure Evaluation (Signed)
 Anesthesia Evaluation  Patient identified by MRN, date of birth, ID band Patient awake    Reviewed: Allergy & Precautions, NPO status , Patient's Chart, lab work & pertinent test results  Airway Mallampati: II  TM Distance: >3 FB Neck ROM: Full    Dental no notable dental hx. (+) Teeth Intact, Dental Advisory Given   Pulmonary asthma , Current Smoker and Patient abstained from smoking.   Pulmonary exam normal breath sounds clear to auscultation       Cardiovascular hypertension, Pt. on medications Normal cardiovascular exam Rhythm:Regular Rate:Normal     Neuro/Psych  Headaches PSYCHIATRIC DISORDERS Anxiety        GI/Hepatic negative GI ROS, Neg liver ROS,,,  Endo/Other    Class 4 obesity (BMI 51)  Renal/GU negative Renal ROS  negative genitourinary   Musculoskeletal negative musculoskeletal ROS (+)    Abdominal   Peds  (+) ADHD Hematology negative hematology ROS (+)   Anesthesia Other Findings   Reproductive/Obstetrics                              Anesthesia Physical Anesthesia Plan  ASA: 3  Anesthesia Plan: General   Post-op Pain Management: Tylenol  PO (pre-op)* and Precedex   Induction: Intravenous  PONV Risk Score and Plan: 1 and Midazolam , Dexamethasone  and Ondansetron   Airway Management Planned: Oral ETT  Additional Equipment:   Intra-op Plan:   Post-operative Plan: Extubation in OR  Informed Consent: I have reviewed the patients History and Physical, chart, labs and discussed the procedure including the risks, benefits and alternatives for the proposed anesthesia with the patient or authorized representative who has indicated his/her understanding and acceptance.     Dental advisory given  Plan Discussed with: CRNA  Anesthesia Plan Comments: (PAT note written 03/12/2024 by Piotr Christopher, PA-C.  )         Anesthesia Quick Evaluation

## 2024-03-13 ENCOUNTER — Ambulatory Visit: Payer: Self-pay | Admitting: General Surgery

## 2024-03-13 DIAGNOSIS — K8020 Calculus of gallbladder without cholecystitis without obstruction: Secondary | ICD-10-CM

## 2024-03-13 NOTE — H&P (Signed)
    HPI  [name] is an 28 y.o. male who was seen in clinic on 02/13/24 for symptomatic cholelithiasis.  Patient states he has had intermittent RUQ pain for over a year but has worsened over the last 3 months and become more persistent. Occasionally has some associated nausea and emesis. Pain will occurs almost every day, both with and without PO intake. Denies diarrhea, fevers/chills. No changes in color of urine or stools. No dysuria.  US  showed cholelithiasis and CT showed hepatomegaly. Labs within normal limits.   10 point review of systems is negative except as listed above in HPI.  Objective  Past Medical History: Past Medical History:  Diagnosis Date   ADHD (attention deficit hyperactivity disorder)    Anxiety    per pt   Asthma    prn inhaler   Fatty liver    per pt   Hypertension    Lateral meniscus tear 02/2017   right   Pre-diabetes    Seasonal allergies     Past Surgical History: Past Surgical History:  Procedure Laterality Date   HAND SURGERY Left    as a child   KNEE ARTHROSCOPY WITH LATERAL MENISECTOMY Right 03/22/2017   Procedure: RIGHT KNEE ARTHROSCOPY WITH LATERAL MENISCECTOMY;  Surgeon: Jane Charleston, MD;  Location: [facility];  Service: Orthopedics;  Laterality: Right;    Family History:  Family History  Problem Relation Age of Onset   Obesity Mother    Sleep apnea Mother    Diabetes Mother    Hypertension Mother    COPD Other    Hyperlipidemia Other    Hypertension Other    Diabetes Maternal Grandmother    Hypertension Maternal Grandmother    Diabetes Paternal Grandfather     Social History:  reports that he has been smoking cigars. He has never used smokeless tobacco. He reports that he does not drink alcohol and does not use drugs.  Allergies: No Known Allergies  Medications: I have reviewed the patient's current medications.  Labs: Pertinent lab work personally reviewed.  Imaging: Pertinent imaging personally  reviewed  RUQ US  01/07/24: Cholelithiasis with GB wall thickness upper limits of normal, no pericholecystic fluid, negative sonographic Murphy's sign. CBD diameter 3mm. CT PE 01/07/24: Hepatomegaly. No obvious GB wall thickening or pericholecystic fluid   Physical Exam There were no vitals taken for this visit. General: No acute distress, well appearing HEENT: PERRL, hearing grossly normal, mucous membranes moist CV: Regular rate and rhythm Pulm: Normal work of breathing on room air Abd: Soft, tender to palpation mildly in RUQ  Extremities: Warm and well perfused Neuro: A&O x4, no focal neurologic deficits Psych: Appropriate mood and effect     Assessment   [name] is an 28 y.o. male with symptomatic cholelithiasis  Plan  - Proceed to OR for laparoscopic cholecystectomy with ICG - We discussed the etiology of patient's pain, we discussed treatment options and recommended surgery. We discussed details of surgery including general anesthesia, laparoscopic approach, identification of cystic duct and common bile duct. Ligation of cystic duct and cystic artery. Possible need for intraoperative cholangiogram, open procedure, and subtotal cholecystectomy. Possible risks of common bile duct injury, injury to surrounding structures, bile leak, bleeding, infection, diarrhea, retained stone and hernia. The patient showed good understanding and all questions were answered    Orie Silversmith, MD [facility] Surgery

## 2024-03-14 ENCOUNTER — Ambulatory Visit (HOSPITAL_COMMUNITY)
Admission: RE | Admit: 2024-03-14 | Discharge: 2024-03-14 | Disposition: A | Discharge status: Home / Self Care | Attending: General Surgery | Admitting: General Surgery

## 2024-03-14 ENCOUNTER — Encounter: Payer: Self-pay | Admitting: Family Medicine

## 2024-03-14 ENCOUNTER — Ambulatory Visit (HOSPITAL_COMMUNITY): Payer: Self-pay | Admitting: Anesthesiology

## 2024-03-14 ENCOUNTER — Ambulatory Visit (HOSPITAL_COMMUNITY): Payer: Self-pay | Admitting: Vascular Surgery

## 2024-03-14 ENCOUNTER — Other Ambulatory Visit: Payer: Self-pay

## 2024-03-14 ENCOUNTER — Encounter (HOSPITAL_COMMUNITY)
Admission: RE | Disposition: A | Discharge status: Home / Self Care | Payer: Self-pay | Source: Home / Self Care | Attending: General Surgery

## 2024-03-14 DIAGNOSIS — K8010 Calculus of gallbladder with chronic cholecystitis without obstruction: Secondary | ICD-10-CM

## 2024-03-14 DIAGNOSIS — I10 Essential (primary) hypertension: Secondary | ICD-10-CM | POA: Insufficient documentation

## 2024-03-14 DIAGNOSIS — F17210 Nicotine dependence, cigarettes, uncomplicated: Secondary | ICD-10-CM

## 2024-03-14 DIAGNOSIS — K8020 Calculus of gallbladder without cholecystitis without obstruction: Secondary | ICD-10-CM | POA: Diagnosis present

## 2024-03-14 DIAGNOSIS — F17290 Nicotine dependence, other tobacco product, uncomplicated: Secondary | ICD-10-CM | POA: Insufficient documentation

## 2024-03-14 DIAGNOSIS — J45909 Unspecified asthma, uncomplicated: Secondary | ICD-10-CM | POA: Insufficient documentation

## 2024-03-14 DIAGNOSIS — E6689 Other obesity not elsewhere classified: Secondary | ICD-10-CM | POA: Diagnosis not present

## 2024-03-14 DIAGNOSIS — Z6841 Body Mass Index (BMI) 40.0 and over, adult: Secondary | ICD-10-CM | POA: Insufficient documentation

## 2024-03-14 HISTORY — PX: CHOLECYSTECTOMY: SHX55

## 2024-03-14 HISTORY — PX: INDOCYANINE GREEN FLUORESCENCE IMAGING (ICG): SHX7595

## 2024-03-14 SURGERY — LAPAROSCOPIC CHOLECYSTECTOMY
Anesthesia: General | Site: Abdomen

## 2024-03-14 MED ORDER — ROCURONIUM BROMIDE 10 MG/ML (PF) SYRINGE
PREFILLED_SYRINGE | INTRAVENOUS | Status: AC
Start: 2024-03-14 — End: 2024-03-14
  Filled 2024-03-14: qty 10

## 2024-03-14 MED ORDER — ONDANSETRON HCL 4 MG/2ML IJ SOLN
INTRAMUSCULAR | Status: AC
  Filled 2024-03-14: qty 2

## 2024-03-14 MED ORDER — DEXAMETHASONE SODIUM PHOSPHATE 10 MG/ML IJ SOLN
INTRAMUSCULAR | Status: AC
Start: 2024-03-14 — End: 2024-03-14
  Filled 2024-03-14: qty 1

## 2024-03-14 MED ORDER — SUGAMMADEX SODIUM 200 MG/2ML IV SOLN
INTRAVENOUS | Status: DC | PRN
  Administered 2024-03-14: 200 mg via INTRAVENOUS

## 2024-03-14 MED ORDER — FENTANYL CITRATE (PF) 250 MCG/5ML IJ SOLN
INTRAMUSCULAR | Status: DC | PRN
  Administered 2024-03-14 (×5): 50 ug via INTRAVENOUS

## 2024-03-14 MED ORDER — ENOXAPARIN SODIUM 40 MG/0.4ML IJ SOSY
40.0000 mg | PREFILLED_SYRINGE | Freq: Once | INTRAMUSCULAR | Status: AC
  Administered 2024-03-14: 40 mg via SUBCUTANEOUS
  Filled 2024-03-14: qty 0.4

## 2024-03-14 MED ORDER — LIDOCAINE 2% (20 MG/ML) 5 ML SYRINGE
INTRAMUSCULAR | Status: DC | PRN
  Administered 2024-03-14: 100 mg via INTRAVENOUS

## 2024-03-14 MED ORDER — CEFAZOLIN SODIUM-DEXTROSE 3-4 GM/150ML-% IV SOLN
3.0000 g | INTRAVENOUS | Status: AC
  Administered 2024-03-14: 3 g via INTRAVENOUS
  Filled 2024-03-14: qty 150

## 2024-03-14 MED ORDER — PROPOFOL 10 MG/ML IV BOLUS
INTRAVENOUS | Status: DC | PRN
  Administered 2024-03-14: 200 mg via INTRAVENOUS

## 2024-03-14 MED ORDER — ACETAMINOPHEN 500 MG PO TABS
1000.0000 mg | ORAL_TABLET | ORAL | Status: AC
  Administered 2024-03-14: 1000 mg via ORAL
  Filled 2024-03-14: qty 2

## 2024-03-14 MED ORDER — CHLORHEXIDINE GLUCONATE CLOTH 2 % EX PADS: 6.0000 | MEDICATED_PAD | Freq: Once | CUTANEOUS | Status: DC

## 2024-03-14 MED ORDER — HYDROMORPHONE HCL 1 MG/ML IJ SOLN
INTRAMUSCULAR | Status: AC
  Filled 2024-03-14: qty 0.5

## 2024-03-14 MED ORDER — OXYCODONE HCL 5 MG PO TABS
ORAL_TABLET | ORAL | Status: AC
  Filled 2024-03-14: qty 1

## 2024-03-14 MED ORDER — FENTANYL CITRATE (PF) 100 MCG/2ML IJ SOLN
INTRAMUSCULAR | Status: AC
  Filled 2024-03-14: qty 2

## 2024-03-14 MED ORDER — AMISULPRIDE (ANTIEMETIC) 5 MG/2ML IV SOLN: 10.0000 mg | Freq: Once | INTRAVENOUS | Status: DC | PRN

## 2024-03-14 MED ORDER — ROCURONIUM BROMIDE 10 MG/ML (PF) SYRINGE
PREFILLED_SYRINGE | INTRAVENOUS | Status: DC | PRN
  Administered 2024-03-14: 80 mg via INTRAVENOUS
  Administered 2024-03-14 (×4): 10 mg via INTRAVENOUS

## 2024-03-14 MED ORDER — ALBUTEROL SULFATE HFA 108 (90 BASE) MCG/ACT IN AERS
INHALATION_SPRAY | RESPIRATORY_TRACT | Status: DC | PRN
Start: 2024-03-14 — End: 2024-03-14
  Administered 2024-03-14 (×2): 8 via RESPIRATORY_TRACT

## 2024-03-14 MED ORDER — LIDOCAINE 2% (20 MG/ML) 5 ML SYRINGE
INTRAMUSCULAR | Status: AC
  Filled 2024-03-14: qty 5

## 2024-03-14 MED ORDER — ORAL CARE MOUTH RINSE: 15.0000 mL | Freq: Once | OROMUCOSAL | Status: AC

## 2024-03-14 MED ORDER — MIDAZOLAM HCL 2 MG/2ML IJ SOLN
INTRAMUSCULAR | Status: AC
  Filled 2024-03-14: qty 2

## 2024-03-14 MED ORDER — CHLORHEXIDINE GLUCONATE 0.12 % MT SOLN
15.0000 mL | Freq: Once | OROMUCOSAL | Status: AC
  Administered 2024-03-14: 15 mL via OROMUCOSAL
  Filled 2024-03-14: qty 15

## 2024-03-14 MED ORDER — ONDANSETRON HCL 4 MG/2ML IJ SOLN
INTRAMUSCULAR | Status: DC | PRN
  Administered 2024-03-14: 4 mg via INTRAVENOUS

## 2024-03-14 MED ORDER — SUGAMMADEX SODIUM 200 MG/2ML IV SOLN
INTRAVENOUS | Status: AC
  Filled 2024-03-14: qty 2

## 2024-03-14 MED ORDER — HEMOSTATIC AGENTS (NO CHARGE) OPTIME
TOPICAL | Status: DC | PRN
  Administered 2024-03-14: 1 via TOPICAL

## 2024-03-14 MED ORDER — DEXAMETHASONE SODIUM PHOSPHATE 10 MG/ML IJ SOLN
INTRAMUSCULAR | Status: DC | PRN
  Administered 2024-03-14: 10 mg via INTRAVENOUS

## 2024-03-14 MED ORDER — GABAPENTIN 300 MG PO CAPS
300.0000 mg | ORAL_CAPSULE | ORAL | Status: AC
  Administered 2024-03-14: 300 mg via ORAL
  Filled 2024-03-14: qty 1

## 2024-03-14 MED ORDER — FENTANYL CITRATE (PF) 100 MCG/2ML IJ SOLN
25.0000 ug | INTRAMUSCULAR | Status: DC | PRN
  Administered 2024-03-14: 50 ug via INTRAVENOUS

## 2024-03-14 MED ORDER — OXYCODONE HCL 5 MG/5ML PO SOLN: 5.0000 mg | Freq: Once | ORAL | Status: AC | PRN

## 2024-03-14 MED ORDER — BUPIVACAINE-EPINEPHRINE 0.25% -1:200000 IJ SOLN
INTRAMUSCULAR | Status: DC | PRN
  Administered 2024-03-14: 10 mL

## 2024-03-14 MED ORDER — ALBUTEROL SULFATE HFA 108 (90 BASE) MCG/ACT IN AERS
INHALATION_SPRAY | RESPIRATORY_TRACT | Status: AC
  Filled 2024-03-14: qty 6.7

## 2024-03-14 MED ORDER — SODIUM CHLORIDE 0.9 % IR SOLN
Status: DC | PRN
Start: 2024-03-14 — End: 2024-03-14
  Administered 2024-03-14: 1000 mL

## 2024-03-14 MED ORDER — MIDAZOLAM HCL 2 MG/2ML IJ SOLN
INTRAMUSCULAR | Status: DC | PRN
  Administered 2024-03-14: 2 mg via INTRAVENOUS

## 2024-03-14 MED ORDER — PROPOFOL 10 MG/ML IV BOLUS
INTRAVENOUS | Status: AC
Start: 2024-03-14 — End: 2024-03-14
  Filled 2024-03-14: qty 20

## 2024-03-14 MED ORDER — OXYCODONE HCL 5 MG PO TABS
5.0000 mg | ORAL_TABLET | Freq: Once | ORAL | Status: AC | PRN
  Administered 2024-03-14: 5 mg via ORAL

## 2024-03-14 MED ORDER — FENTANYL CITRATE (PF) 250 MCG/5ML IJ SOLN
INTRAMUSCULAR | Status: AC
Start: 2024-03-14 — End: 2024-03-14
  Filled 2024-03-14: qty 5

## 2024-03-14 MED ORDER — LACTATED RINGERS IV SOLN: INTRAVENOUS | Status: DC

## 2024-03-14 MED ORDER — INDOCYANINE GREEN 25 MG IV SOLR
1.2500 mg | Freq: Once | INTRAVENOUS | Status: AC
  Administered 2024-03-14: 1.25 mg via TOPICAL
  Filled 2024-03-14: qty 10

## 2024-03-14 MED ORDER — 0.9 % SODIUM CHLORIDE (POUR BTL) OPTIME
TOPICAL | Status: DC | PRN
  Administered 2024-03-14: 1000 mL

## 2024-03-14 MED ORDER — BUPIVACAINE-EPINEPHRINE (PF) 0.25% -1:200000 IJ SOLN
INTRAMUSCULAR | Status: AC
Start: 2024-03-14 — End: 2024-03-14
  Filled 2024-03-14: qty 30

## 2024-03-14 MED ORDER — OXYCODONE HCL 5 MG PO TABS: 5.0000 mg | ORAL_TABLET | ORAL | 0 refills | Status: AC | PRN

## 2024-03-14 SURGICAL SUPPLY — 47 items
APPLICATOR ARISTA FLEXITIP XL (MISCELLANEOUS) IMPLANT
BAG COUNTER SPONGE SURGICOUNT (BAG) ×2 IMPLANT
BLADE CLIPPER SURG (BLADE) IMPLANT
CANISTER SUCTION 3000ML PPV (SUCTIONS) ×2 IMPLANT
CHLORAPREP W/TINT 26 (MISCELLANEOUS) ×2 IMPLANT
CLIP APPLIE ROT 10 11.4 M/L (STAPLE) IMPLANT
CLIP LIGATING HEMO O LOK GREEN (MISCELLANEOUS) ×2 IMPLANT
CNTNR URN SCR LID CUP LEK RST (MISCELLANEOUS) ×2 IMPLANT
COVER SURGICAL LIGHT HANDLE (MISCELLANEOUS) ×2 IMPLANT
DERMABOND ADVANCED .7 DNX12 (GAUZE/BANDAGES/DRESSINGS) ×2 IMPLANT
ELECTRODE REM PT RTRN 9FT ADLT (ELECTROSURGICAL) ×2 IMPLANT
ENDOLOOP SUT PDS II 0 18 (SUTURE) IMPLANT
GLOVE BIOGEL PI MICRO STRL 6 (GLOVE) ×2 IMPLANT
GLOVE INDICATOR 6.5 STRL GRN (GLOVE) ×2 IMPLANT
GOWN STRL REUS W/ TWL LRG LVL3 (GOWN DISPOSABLE) ×6 IMPLANT
GRASPER SUT TROCAR 14GX15 (MISCELLANEOUS) ×2 IMPLANT
HEMOSTAT ARISTA ABSORB 3G PWDR (HEMOSTASIS) IMPLANT
IRRIGATION SUCT STRKRFLW 2 WTP (MISCELLANEOUS) ×2 IMPLANT
KIT BASIN OR (CUSTOM PROCEDURE TRAY) ×2 IMPLANT
KIT IMAGING PINPOINTPAQ (MISCELLANEOUS) IMPLANT
KIT TURNOVER KIT B (KITS) ×2 IMPLANT
LHOOK LAP DISP 36CM (ELECTROSURGICAL) ×2 IMPLANT
NDL INSUFFLATION 14GA 120MM (NEEDLE) ×2 IMPLANT
NEEDLE INSUFFLATION 14GA 120MM (NEEDLE) ×2 IMPLANT
NS IRRIG 1000ML POUR BTL (IV SOLUTION) ×2 IMPLANT
PAD ARMBOARD POSITIONER FOAM (MISCELLANEOUS) ×2 IMPLANT
PENCIL BUTTON HOLSTER BLD 10FT (ELECTRODE) ×2 IMPLANT
POUCH LAPAROSCOPIC INSTRUMENT (MISCELLANEOUS) ×2 IMPLANT
POUCH RETRIEVAL ECOSAC 10 (ENDOMECHANICALS) IMPLANT
POWDER SURGICEL 3.0 GRAM (HEMOSTASIS) IMPLANT
SCISSORS LAP 5X35 DISP (ENDOMECHANICALS) ×2 IMPLANT
SET TUBE SMOKE EVAC HIGH FLOW (TUBING) ×2 IMPLANT
SLEEVE Z-THREAD 5X100MM (TROCAR) ×4 IMPLANT
SPECIMEN JAR SMALL (MISCELLANEOUS) ×2 IMPLANT
SUT MNCRL AB 4-0 PS2 18 (SUTURE) ×2 IMPLANT
SUT VICRYL 0 UR6 27IN ABS (SUTURE) IMPLANT
SYSTEM BAG RETRIEVAL 10MM (BASKET) ×2 IMPLANT
TIP ENDOSCOPIC SURGICEL (TIP) IMPLANT
TOWEL GREEN STERILE (TOWEL DISPOSABLE) IMPLANT
TOWEL GREEN STERILE FF (TOWEL DISPOSABLE) ×2 IMPLANT
TRAY LAPAROSCOPIC MC (CUSTOM PROCEDURE TRAY) ×2 IMPLANT
TROCAR XCEL NON-BLD 5MMX100MML (ENDOMECHANICALS) IMPLANT
TROCAR Z THREAD OPTICAL 12X100 (TROCAR) ×2 IMPLANT
TROCAR Z THREAD OPTICAL 5X150 (TROCAR) IMPLANT
TROCAR Z-THREAD OPTICAL 5X100M (TROCAR) ×2 IMPLANT
WARMER LAPAROSCOPE (MISCELLANEOUS) ×2 IMPLANT
WATER STERILE IRR 1000ML POUR (IV SOLUTION) ×2 IMPLANT

## 2024-03-14 NOTE — Op Note (Addendum)
 03/14/2024 8:59 AM  PATIENT: [name]  28 y.o. male  Patient Care Team: Nicholas Bar, MD as PCP - General (Family Medicine)  PRE-OPERATIVE DIAGNOSIS: symptomatic cholelithiasis  POST-OPERATIVE DIAGNOSIS: symptomatic cholelithiasis and chronic cholecystitis  PROCEDURE: laparoscopic subtotal reconstituting cholecystectomy with ICG  SURGEON: Orie Silversmith, MD  ASSISTANT: Adina Bury, MD  ANESTHESIA: General endotracheal  EBL: 10cc  DRAINS: None  SPECIMEN: Gallbladder  COUNTS: Sponge, needle and instrument counts were reported correct x2 at the conclusion of the operation  DISPOSITION: PACU in satisfactory condition  COMPLICATIONS: None  FINDINGS: Chronically inflamed gallbladder with dilated cystic duct. Unable to clearly dissect cystic duct circumferentially and ICG showed gallbladder very low. To avoid any inadvertent injury to CBD decision was made to take gallbladder at the neck and forgo further dissection lower to skeletonize the cystic duct. Gallbladder dissected free from liver in dome down approach. Hole made in body of gallbladder using electrocautery in order to remove any stones prior to dividing the neck.   DESCRIPTION:  Preoperative indocyanine green  was administered in preoperative holding. The patient was identified & brought into the operating room. He was then positioned supine on the OR table. SCDs were in place and active during the entire case. He then underwent general endotracheal anesthesia. Pressure points were padded. Hair on the abdomen was clipped by the OR team. The abdomen was prepped and draped in the standard sterile fashion. Antibiotics were administered. A surgical timeout was performed and confirmed our plan.  A small incision was made in the LUQ at Palmer's point and a veress needle was inserted. Air was aspirated and subsequent positive drop test. Abdomen then insufflated to . A periumbilical incision was then made and a 5mm  trocar optiview using a 30 degree scope was inserted and the abdomen was entered under direct visualization. Inspection confirmed no evidence of trocar or veress site complications. The veress was then removed.    The patient was then positioned in reverse Trendelenburg with slight left side down. A 12 mm supxiphoid trocar was placed under direct visualization and  two additional 5mm trocars were placed along the right subcostal line - one 5mm port in mid subcostal region, another 5mm port in the right flank near the anterior axillary line.  The liver and gallbladder were inspected. Chronically inflamed gallbladder that was intrahepatic. The gallbladder fundus was grasped and elevated cephalad. An additional grasper was then placed on the infundibulum of the gallbladder and the infundibulum was retracted laterally. Staying high on the gallbladder, the peritoneum on both sides of the gallbladder was opened with hook cautery. ICG employed and CBD fluorescence seen far from level of dissection. Gentle blunt dissection was then employed with a [place]  dissector. Able to skeletonize cystic artery and confirmed not a biliary structure with ICG. This was divided with hemolock clips, 2 patient side, and 1 specimen side. Continued dissection and could not clearly skeletonize cystic duct. Decision made to convert to dome down approach. Once at the level of the neck of the gallbladder, encountered a posterior cystic duct which was divided with hemolock clips in similar fashion. ICG again employed and gallbladder, CBD and small bowerl fluorescence seen. Still unable to clearly differentiate cystic duct from gallbladder and given this, decision made to transect gallbladder at the neck to avoid any iatrogenic CBD injury.   Using electrocautery, hole made in gallbladder body and bile and any stones were suctioned out. Then endoloop x 2 were placed below this defect at the neck of the  gallbladder. Gallbladder then transected  distal to endoloop and placed in retrieval bag.  ICG employed once more and confirmed fluorescence in common bile duct and into small intestine.  The RUQ was gently irrigated with sterile saline. There was some oozing in gallbladder fossa and this was controlled with electrocautery and surgicel powder. Hemostasis was then verified. The clips on the cystic artery and posterior cystic artery were in good position; the gallbladder fossa was dry. The rest of the abdomen was inspected no injury nor bleeding elsewhere was identified.  The retrieval bag containing the gallbladder was then removed from the subxiphoid port site and passed off as specimen. The subxiphoid port fascia was then closed in a figured of eight fashion with 0 vicryl using a suture passer. The RUQ ports were removed under direct visualization and noted to be hemostatic.SABRA The fascia was palpated and noted to be completely closed. The abdomen was then desufflated and the periumbilical trocar removed. The skin of all incision sites was approximated with 4-0 monocryl subcuticular suture and dermabond applied. The patient was then awakened from anesthesia, extubated, and transferred to a stretcher for transport to PACU in satisfactory condition.  Instrument, sponge, and needle counts were correct at closure and at the conclusion of the case.   Orie Silversmith, MD [facility] Surgery

## 2024-03-14 NOTE — Interval H&P Note (Signed)
 History and Physical Interval Note:  03/14/2024 8:34 AM  [name]  has presented today for surgery, with the diagnosis of symptomatic cholelithiasis.  The various methods of treatment have been discussed with the patient and family. After consideration of risks, benefits and other options for treatment, the patient has consented to  Procedure(s): LAPAROSCOPIC CHOLECYSTECTOMY (N/A) as a surgical intervention.  The patient's history has been reviewed, patient examined, no change in status, stable for surgery.  I have reviewed the patient's chart and labs.  Questions were answered to the patient's satisfaction.     Richerd Silversmith

## 2024-03-14 NOTE — Anesthesia Postprocedure Evaluation (Signed)
 Anesthesia Post Note  Patient: THADDEUS EVITTS  Procedure(s) Performed: LAPAROSCOPIC CHOLECYSTECTOMY INDOCYANINE GREEN  FLUORESCENCE IMAGING (ICG) (Abdomen)     Patient location during evaluation: PACU Anesthesia Type: General Level of consciousness: awake and alert Pain management: pain level controlled Vital Signs Assessment: post-procedure vital signs reviewed and stable Respiratory status: spontaneous breathing, nonlabored ventilation and respiratory function stable Cardiovascular status: blood pressure returned to baseline Postop Assessment: no apparent nausea or vomiting Anesthetic complications: no   No notable events documented.  Last Vitals:  Vitals:   03/14/24 1230 03/14/24 1240  BP: (!) 142/86 (!) 142/84  Pulse: 98 94  Resp: 13 16  Temp:  36.7 C  SpO2: 97% 93%    Last Pain:  Vitals:   03/14/24 1240  TempSrc:   PainSc: Asleep                 Vertell Row

## 2024-03-14 NOTE — Transfer of Care (Signed)
 Immediate Anesthesia Transfer of Care Note  Patient: [name]  Procedure(s) Performed: LAPAROSCOPIC CHOLECYSTECTOMY INDOCYANINE GREEN  FLUORESCENCE IMAGING (ICG) (Abdomen)  Patient Location: PACU  Anesthesia Type:General  Level of Consciousness: drowsy  Airway & Oxygen Therapy: Patient Spontanous Breathing and Patient connected to nasal cannula oxygen  Post-op Assessment: Report given to RN and Post -op Vital signs reviewed and stable  Post vital signs: reviewed, stable  Last Vitals:  Vitals Value Taken Time  BP 147/94 03/14/24 11:56  Temp 98.1   Pulse 101 03/14/24 11:55  Resp 10 03/14/24 11:55  SpO2 92 % 03/14/24 11:55  Vitals shown include unfiled device data.  Last Pain:  Vitals:   03/14/24 0812  TempSrc:   PainSc: 0-No pain         Complications: No notable events documented.

## 2024-03-14 NOTE — Anesthesia Procedure Notes (Signed)
 Procedure Name: Intubation Date/Time: 03/14/2024 9:16 AM  Performed by: Atanacio Arland HERO, CRNAPre-anesthesia Checklist: Patient identified, Emergency Drugs available, Suction available and Patient being monitored Patient Re-evaluated:Patient Re-evaluated prior to induction Oxygen Delivery Method: Circle System Utilized Preoxygenation: Pre-oxygenation with 100% oxygen Induction Type: IV induction Ventilation: Mask ventilation without difficulty Laryngoscope Size: Mac and 4 Grade View: Grade II Tube type: Oral Tube size: 8.0 mm Number of attempts: 1 Airway Equipment and Method: Stylet Placement Confirmation: ETT inserted through vocal cords under direct vision, positive ETCO2 and breath sounds checked- equal and bilateral Secured at: 23 cm Tube secured with: Tape Dental Injury: Teeth and Oropharynx as per pre-operative assessment

## 2024-03-15 ENCOUNTER — Encounter (HOSPITAL_COMMUNITY): Payer: Self-pay | Admitting: General Surgery

## 2024-03-15 LAB — SURGICAL PATHOLOGY

## 2024-03-17 NOTE — Procedures (Signed)
 [name] [address] [place], [place] [address] Tel: [phone]   Fax: [phone]  Split Night Interpretation  Patient Name:  [name], Lortie Date:  03/07/2024 Referring Physician:  Rollene Keeling MD %%startinterp%% Indications for Polysomnography The patient is a 28 year old Male who is 5' 11 and weighs 370.0 lbs.  His BMI equals 51.8.  A diagnostic polysomnogram was performed to evaluate for -.OSA.   After 149.5 minutes of sleep time the patient exhibited sufficient respiratory events qualifying him for a CPAP trial which was then initiated.    Medication  No Data.   Polysomnogram Data A full night polysomnogram was performed recording the standard physiologic parameters including EEG, EOG, EMG, EKG, nasal and oral airflow.  Respiratory parameters of chest and abdominal movements are recorded with [place]-Crystal motion transducers.  Oxygen saturation was recorded by pulse oximetry.    Sleep Architecture The total recording time of the diagnostic portion of the study was 216.9 minutes.  The total sleep time was 149.5 minutes.  During the diagnostic portion of the study, the patient spent 2.0% of total sleep time in Stage N1, 78.9% in Stage N2, 19.1% in Stages N3, and 0.0% in REM.   Sleep latency was 1.4 minutes.  REM latency was - minutes.  Sleep Efficiency was 68.9%.  Wake after Sleep Onset time was 66.0 minutes.   At 01:53:26 AM the patient was placed on PAP treatment and was titrated at pressures ranging from 5* cm/H20 with supplemental oxygen at - up to 11* cm/H20 with supplemental oxygen at -.  The total recording time of the treatment portion of the study was 176.3 minutes.  The total sleep time was 31.5 minutes.  During the treatment portion of the study, the patient spent 12.7% of total sleep time in Stage N1, 87.3% in Stage N2, 0.0% in Stages N3, and 0.0% in REM.   Sleep latency was 22.5 minutes.  REM latency was - minutes.   Sleep Efficiency was 17.9%.  Wake after Sleep Onset time was 122.0 minutes.  Respiratory Events During the diagnostic portion of the study, the polysomnogram revealed a presence of 9 obstructive, - central, and - mixed apneas resulting in an Apnea index of 3.6 events per hour.  There were 46 hypopneas (>=3% desaturation and/or arousal) resulting in an Apnea\Hypopnea Index (AHI >=3% desaturation and/or arousal) of 22.1 events per hour.  There were 31 hypopneas (>=4% desaturation) resulting in an Apnea\Hypopnea Index (AHI >=4% desaturation) of 16.1 events per hour.  There were - Respiratory Effort Related Arousals resulting in a RERA index of - events per hour. The Respiratory Disturbance Index is 22.1 events per hour.  The snore index was - events per hour.  Mean oxygen saturation was 92.1%.  The lowest oxygen saturation during sleep was 84.0%.  Time spent <=88% oxygen saturation was 4.4 minutes (2.3%).  During the treatment portion of the study, the polysomnogram revealed a presence of - obstructive, - central, and - mixed apneas resulting in an Apnea index of - events per hour.  There were 1 hypopneas (>=3% desaturation and/or arousal) resulting in an Apnea\Hypopnea Index (AHI >=3% desaturation and/or arousal) of 1.9 events per hour.  There were 1 hypopneas (>=4% desaturation) resulting in an Apnea\Hypopnea Index (AHI >=4% desaturation) of 1.9 events per hour.  There were - Respiratory Effort Related Arousals resulting in a RERA index of - events per hour. The Respiratory Disturbance Index is 1.9 events per hour.  The snore index was - events per hour.  Mean oxygen saturation was 93.4%.  The lowest oxygen saturation during sleep was 91.0%.  Time spent <=88% oxygen saturation was - minutes (-).  Limb Activity During the diagnostic portion of the study, there were 18 limb movements recorded.  Of this total, 4 were classified as PLMs.  Of the PLMs, - were associated with arousals.  The Limb Movement index was  7.2 per hour while the PLM index was 1.6 per hour.  During the treatment portion of the study, there were 7 limb movements recorded.  Of this total, 4 were classified as PLMs.  Of the PLMs, - were associated with arousals.  The Limb Movement index was 13.3 per hour while the PLM index was 7.6 per hour.  Cardiac Summary During the diagnostic portion of the study, the average pulse rate was 71.0 bpm.  The minimum pulse rate was 47.0 bpm while the maximum pulse rate was 113.0 bpm.  During the treatment portion of the study, the average pulse rate was 76.7 bpm.  The minimum pulse rate was 56.0 bpm while the maximum pulse rate was 116.0 bpm.   Comment: Moderate obstructive sleep apnea, AHI (3%) 22.1/hr. Snoring with oxygen desaturation to a nadir of 84%, mean 92.1%. CPAP titration to 11 cwp, residual AHI (3%) 0/hr, minimum O2 saturation 92%, mean 92.8%. Patient had some difficulty maintaining sleep with CPAP.  Diagnosis: Obstructive sleep apnea  Recommendations: Autopap 5-15 or fixed CPAP 11. Patient wore a large F&P Simplus full face mask with heated humidification. Consider if a sleep aid would be helpful while getting used to CPAP.   This study was personally reviewed and electronically signed by: Reggy Salt MD Accredited Board Certified in Sleep Medicine Date/Time:  03/17/24 11:51    %%endinterp%%  Split Night Report  Patient Name: [name], [name] Date: 03/07/2024  Date of Birth: 10-08-1995 Study Type: Split Night  Age: 28 year MRN #: [id number]  Sex: Male Interpreting Physician: SALT REGGY, [id number]  Height: 5' 11 Referring Physician: Rollene Keeling MD  Weight: 370.0 lbs Recording Tech: Prentice Coombe RPSGT RST  BMI: 51.8 Scoring Tech: Prentice Coombe RPSGT RST  ESS: 6 Neck Size: 19  Mask Type Simplus Full Face Mask Final Pressure: 11cmH20  Mask Size: Large Supplemental O2: N/A   Study Overview  DIAGNOSTIC TREATMENT  Lights Off: 10:16:18 PM Lights Off: 01:53:14 AM  Lights On:  01:53:14 AM Lights On: 04:49:34 AM  Time in Bed: 216.9 min. Time in Bed: 176.3 min.  Total Sleep Time: 149.5 min. Total Sleep Time: 31.5 min.  Sleep Efficiency: 68.9% Sleep Efficiency: 17.9%  Sleep Latency: 1.4 min. Sleep Latency: 22.5 min.  REM Latency from Sleep Onset: - min. REM Latency from Sleep Onset: - min.  Wake After Sleep Onset: 66.0 min. Wake After Sleep Onset: 122.0 min.   DIAGNOSTIC TREATMENT   Count Index  Count Index  Awakenings: 18 7.2 Awakenings: 12 22.9  Arousals: 6 2.4 Arousals: - -  AHI (>=3% Desat and/or Ar.): 55 22.1 AHI (>=3% Desat and/or Ar.): 1 1.9  AHI (>=4% Desat): 40 16.1 AHI (>=4% Desat): 1 1.9   Limb Movements: 18 7.2 Limb Movements: 7 13.3  Snore: - - Snore: - -  Desaturations: 58 23.3 Desaturations: 1 1.9  Minimum SpO2 TST: 84.0% Minimum SpO2 TST: 91.0%    Sleep Architecture   DIAGNOSTIC TREATMENT ENTIRE NIGHT  Stages Time (mins) % Sleep Time Time (mins) % Sleep Time Time (mins) % Sleep Time  Wake 67.5  145.0  212.5   Stage N1 3.0 2.0% 4.0 12.7% 7.0 3.9%  Stage N2 118.0 78.9% 27.5 87.3% 145.5 80.4%  Stage N3 28.5 19.1% 0.0 0.0% 28.5 15.7%  REM 0.0 0.0% 0.0 0.0% 0.0 0.0%   Arousal Summary   DIAGNOSTIC TREATMENT   NREM REM TST Index NREM REM TST Index  Respiratory Ar. - - - - - - - -  PLM Ar. - - - - - - - -  Isolated Limb Movement Ar. - - - - - - - -  Snore Ar. - - - - - - - -  Spontaneous Ar. 6 - 6 2.4 - - - -  Total Ar. 6 - 6 2.4 - - - -    Respiratory Summary  DIAGNOSTIC By Sleep Stage By Body Position Total   NREM REM Supine Non-Supine   Time (min) 149.5 0.0 - 149.5 149.5         Obstructive Apnea 9 - - 9 9  Mixed Apnea - - - - -  Central Apnea - - - - -  Total Apneas 9 - - 9 9  Total Apnea Index 3.6 - - 3.6 3.6         Hypopneas (>=3% Desat and/or Ar.) 46 - - 46 46  AHI (>=3% Desat and/or Ar.) 22.1 - - 22.1 22.1         Hypopneas (>=4% Desat) 31 - - 31 31  AHI (>=4% Desat) 16.1 - - 16.1 16.1          RERAs - - - - -  RERA  Index - - - - -         RDI 22.1 - - 22.1 22.1    TREATMENT By Sleep Stage By Body Position Total   NREM REM Supine Non-Supine   Time (min) 31.5 0.0 11.0 20.5 31.5         Obstructive Apnea - - - - -  Mixed Apnea - - - - -  Central Apnea - - - - -  Total Apneas - - - - -  Total Apnea Index - - - - -         Hypopneas (>=3% Desat and/or Ar.) 1 - - 1 1  AHI (>=3% Desat and/or Ar.) 1.9 - - 2.9 1.9         Hypopneas (>=4% Desat) 1 - - 1 1  AHI (>=4% Desat) 1.9 - - 2.9 1.9          RERAs - - - - -  RERA Index - - - - -         RDI 1.9 - - 2.9 1.9    Respiratory Event Durations   DIAGNOSTIC TREATMENT  Apnea NREM REM NREM REM  Average (seconds) 16.2 - - -  Maximum (seconds) 19.9 - - -  Hypopnea      Average (seconds) 25.6 - 55.3 -  Maximum (seconds) 51.6 - 55.3 -    Limb Movement Summary   DIAGNOSTIC TREATMENT   Count Index Count Index  Isolated Limb Movements 14 5.6 3 5.7  Periodic Limb Movements (PLMs) 4 1.6 4 7.6  Total Limb Movements 18 7.2 7 13.3    Oxygen Saturation Summary   DIAGNOSTIC TREATMENT   Wake NREM REM TST Wake NREM REM TST  Average SpO2 92.9% 91.6% - 91.6% 93.4% 93.1% - 93.1%  Minimum SpO2 84.0% 84.0% - 84.0%  89.0% 91.0% - 91.0%   Maximum SpO2  98.0% 98.0% - 98.0%  97.0% 96.0% - 96.0%    DIAGNOSTIC Oxygen Saturation Distribution  Range (%) Time in range (min) Time in range (%)   90.0 - 100.0 172.8 89.6%  80.0 - 90.0 20.1 10.4%  70.0 - 80.0 - -  60.0 - 70.0 - -  50.0 - 60.0 - -  0.0 - 50.0 - -  Time Spent <=88% SpO2  Range (%) Time in range (min) Time in range (%)  0.0 - 88.0 4.4 2.3%      Count Index  Desaturations: 58 23.3   TREATMENT Oxygen Saturation Distribution  Range (%) Time in range (min) Time in range (%)   90.0 - 100.0 166.3 99.8%  80.0 - 90.0 0.4 0.2%  70.0 - 80.0 - -  60.0 - 70.0 - -  50.0 - 60.0 - -  0.0 - 50.0 - -  Time Spent <=88% SpO2  Range (%) Time in range (min) Time in range (%)  0.0 - 88.0 - -       Count Index  Desaturations: 1 1.9     Cardiac Summary   DIAGNOSTIC TREATMENT   Wake NREM REM Total Wake NREM REM Total  Average Pulse Rate (BPM) 80.4 66.1 - 71.0 78.5 68.9 - 76.7  Minimum Pulse Rate (BPM) 57.0 47.0 - 47.0 57.0 56.0 - 56.0  Maximum Pulse Rate (BPM) 113.0 100.0 - 113.0 116.0 93.0 - 116.0   Pulse Rate Distribution   DIAGNOSTIC  Range (bpm) Time in range (min) Time in range (%)  0.0 - 40.0 - -  40.0 - 60.0 38.3 19.7%  60.0 - 80.0 115.4 59.5%  80.0 - 100.0 38.1 19.6%  100.0 - 120.0 2.1 1.1%  120.0 - 140.0 - -  140.0 - 200.0 - -   TREATMENT  Range (bpm) Time in range (min) Time in range (%)  0.0 - 40.0 - -  40.0 - 60.0 4.4 2.6%  60.0 - 80.0 110.8 66.5%  80.0 - 100.0 50.5 30.3%  100.0 - 120.0 1.0 0.6%  120.0 - 140.0 - -  140.0 - 200.0 - -    Titration Summary  PAP Device PAP Level O2 Level Time (min) Wake (min) NREM (min) REM (min) Sleep Eff% OA# CA# MA# Hyp# (>=3%) AHI (>=3%) Hyp# (>=4%) AHI (>=%4) RERA RDI OSat <=88% (min) Min [employer] Ar. Index  - Off - 217.0 67.5 149.5 0.0 68.9% 9 - - 46 22.1 31  16.1 -  22.1  4.3 84.0 91.6 2.4  CPAP 5 - 3.0 3.0 0.0 0.0 0.0%               CPAP 7 - 67.5 53.0 14.5 0.0 21.5% - - - - - -  - -  -  0.0 92.0 93.4 -  CPAP 9 - 23.0 7.5 15.5 0.0 67.4% - - - 1 3.9 1  3.9 -  3.9  0.0 91.0 92.9 -  CPAP 11 - 83.0 81.5 1.5 0.0 1.8% - - - - - -  - -  -  0.0 92.0 92.8 -    Hypnograms                           Technologist Comments  STUDY TYPE= SPLIT DATE= 03/07/2024 MASK= SIMPLUS FULL FACE MASK SIZE- LARGE PRESSURE= 11CMH20 Patient Present to the lab for Split Night Study. Tech Explained the Process of Hook up  and Wires. Patient Expressed Understanding. Hook up was Smooth.  Patient had difficulty falling asleep. Tech Observed Moderate Sleep Disorder Breathing as the study Progressed. Split was meet. Titration was started. Pressure was started on 5cmH20. Patient struggled to fall asleep. Pressure  was increased to 11cmH20.  Patient did not Produced quality sleep after Split.  Patient was restless. No Medication was taking at the lab. Bathroom break was done one time. Simplus Full Face Mask was used for this study.                           Reggy Salt Diplomate, Biomedical engineer of Sleep Medicine  ELECTRONICALLY SIGNED ON:  03/17/2024, 11:39 AM [place] [facility] PH: [phone]   FX: [phone] ACCREDITED BY THE AMERICAN ACADEMY OF SLEEP MEDICINE

## 2024-03-19 NOTE — Addendum Note (Signed)
 Addendum  created 03/19/24 9166 by Paul Lamarr BRAVO, MD   Attestation recorded in Intraprocedure, Intraprocedure Attestations filed

## 2024-03-24 ENCOUNTER — Other Ambulatory Visit (HOSPITAL_COMMUNITY): Payer: Self-pay

## 2024-03-24 ENCOUNTER — Other Ambulatory Visit: Payer: Self-pay

## 2024-03-24 ENCOUNTER — Emergency Department (HOSPITAL_COMMUNITY)

## 2024-03-24 ENCOUNTER — Encounter (HOSPITAL_COMMUNITY): Payer: Self-pay

## 2024-03-24 ENCOUNTER — Emergency Department (HOSPITAL_COMMUNITY)
Admission: EM | Admit: 2024-03-24 | Discharge: 2024-03-24 | Disposition: A | Discharge status: Home / Self Care | Attending: Emergency Medicine | Admitting: Emergency Medicine

## 2024-03-24 DIAGNOSIS — R109 Unspecified abdominal pain: Secondary | ICD-10-CM | POA: Insufficient documentation

## 2024-03-24 DIAGNOSIS — J45909 Unspecified asthma, uncomplicated: Secondary | ICD-10-CM | POA: Diagnosis not present

## 2024-03-24 DIAGNOSIS — I10 Essential (primary) hypertension: Secondary | ICD-10-CM | POA: Insufficient documentation

## 2024-03-24 DIAGNOSIS — Z79899 Other long term (current) drug therapy: Secondary | ICD-10-CM | POA: Diagnosis not present

## 2024-03-24 DIAGNOSIS — Z9049 Acquired absence of other specified parts of digestive tract: Secondary | ICD-10-CM | POA: Diagnosis not present

## 2024-03-24 LAB — COMPREHENSIVE METABOLIC PANEL WITH GFR
ALT: 33 U/L (ref 0–44)
AST: 22 U/L (ref 15–41)
Albumin: 3.7 g/dL (ref 3.5–5.0)
Alkaline Phosphatase: 56 U/L (ref 38–126)
Anion gap: 7 (ref 5–15)
BUN: 9 mg/dL (ref 6–20)
CO2: 23 mmol/L (ref 22–32)
Calcium: 8.8 mg/dL — ABNORMAL LOW (ref 8.9–10.3)
Chloride: 105 mmol/L (ref 98–111)
Creatinine, Ser: 0.85 mg/dL (ref 0.61–1.24)
GFR, Estimated: >60 mL/min (ref >60)
Glucose, Bld: 117 mg/dL — ABNORMAL HIGH (ref 70–99)
Potassium: 3.9 mmol/L (ref 3.5–5.1)
Sodium: 135 mmol/L (ref 135–145)
Total Bilirubin: 0.6 mg/dL (ref 0.0–1.2)
Total Protein: 7.6 g/dL (ref 6.5–8.1)

## 2024-03-24 LAB — CBC WITH DIFFERENTIAL/PLATELET
Abs Immature Granulocytes: 0.02 K/uL (ref 0.00–0.07)
Basophils Absolute: 0.1 K/uL (ref 0.0–0.1)
Basophils Relative: 1 %
Eosinophils Absolute: 0.1 K/uL (ref 0.0–0.5)
Eosinophils Relative: 2 %
HCT: 46.2 % (ref 39.0–52.0)
Hemoglobin: 14.9 g/dL (ref 13.0–17.0)
Immature Granulocytes: 0 %
Lymphocytes Relative: 25 %
Lymphs Abs: 2.2 K/uL (ref 0.7–4.0)
MCH: 29.9 pg (ref 26.0–34.0)
MCHC: 32.3 g/dL (ref 30.0–36.0)
MCV: 92.6 fL (ref 80.0–100.0)
Monocytes Absolute: 0.6 K/uL (ref 0.1–1.0)
Monocytes Relative: 7 %
Neutro Abs: 5.7 K/uL (ref 1.7–7.7)
Neutrophils Relative %: 65 %
Platelets: 237 K/uL (ref 150–400)
RBC: 4.99 MIL/uL (ref 4.22–5.81)
RDW: 13.9 % (ref 11.5–15.5)
WBC: 8.7 K/uL (ref 4.0–10.5)
nRBC: 0 % (ref 0.0–0.2)

## 2024-03-24 LAB — LIPASE, BLOOD: Lipase: 27 U/L (ref 11–51)

## 2024-03-24 MED ORDER — ONDANSETRON HCL 4 MG/2ML IJ SOLN
4.0000 mg | Freq: Once | INTRAMUSCULAR | Status: AC
  Administered 2024-03-24: 4 mg via INTRAVENOUS
  Filled 2024-03-24: qty 2

## 2024-03-24 MED ORDER — HYDROCODONE-ACETAMINOPHEN 5-325 MG PO TABS
1.0000 | ORAL_TABLET | Freq: Four times a day (QID) | ORAL | 0 refills | Status: DC | PRN
  Filled 2024-03-24: qty 7, 2d supply, fill #0

## 2024-03-24 MED ORDER — KETOROLAC TROMETHAMINE 15 MG/ML IJ SOLN
15.0000 mg | Freq: Once | INTRAMUSCULAR | Status: AC
  Administered 2024-03-24: 15 mg via INTRAVENOUS
  Filled 2024-03-24: qty 1

## 2024-03-24 MED ORDER — SODIUM CHLORIDE 0.9 % IV BOLUS
1000.0000 mL | Freq: Once | INTRAVENOUS | Status: AC
  Administered 2024-03-24: 1000 mL via INTRAVENOUS

## 2024-03-24 MED ORDER — LIDOCAINE 5 % EX PTCH: 1.0000 | MEDICATED_PATCH | CUTANEOUS | 0 refills | Status: AC

## 2024-03-24 MED ORDER — IOHEXOL 350 MG/ML SOLN
75.0000 mL | Freq: Once | INTRAVENOUS | Status: AC | PRN
  Administered 2024-03-24: 75 mL via INTRAVENOUS

## 2024-03-24 MED ORDER — HYDROCODONE-ACETAMINOPHEN 5-325 MG PO TABS: 1.0000 | ORAL_TABLET | Freq: Four times a day (QID) | ORAL | 0 refills | Status: AC | PRN

## 2024-03-24 MED ORDER — LIDOCAINE 5 % EX PTCH
1.0000 | MEDICATED_PATCH | CUTANEOUS | 0 refills | Status: DC
Start: 2024-03-24 — End: 2024-03-24
  Filled 2024-03-24: qty 30, 30d supply, fill #0

## 2024-03-24 MED ORDER — METHOCARBAMOL 500 MG PO TABS: 500.0000 mg | ORAL_TABLET | Freq: Two times a day (BID) | ORAL | 0 refills | Status: AC

## 2024-03-24 MED ORDER — MORPHINE SULFATE (PF) 4 MG/ML IV SOLN
4.0000 mg | Freq: Once | INTRAVENOUS | Status: AC
  Administered 2024-03-24: 4 mg via INTRAVENOUS
  Filled 2024-03-24: qty 1

## 2024-03-24 MED ORDER — METHOCARBAMOL 500 MG PO TABS
500.0000 mg | ORAL_TABLET | Freq: Two times a day (BID) | ORAL | 0 refills | Status: DC
  Filled 2024-03-24: qty 20, 10d supply, fill #0

## 2024-03-24 MED ORDER — HYDROCODONE-ACETAMINOPHEN 5-325 MG PO TABS
1.0000 | ORAL_TABLET | Freq: Once | ORAL | Status: AC
  Administered 2024-03-24: 1 via ORAL
  Filled 2024-03-24: qty 1

## 2024-03-24 NOTE — ED Provider Notes (Signed)
 [place] EMERGENCY DEPARTMENT AT [facility] Provider Note   CSN: [id number] Arrival date & time: 03/24/24  9080     Patient presents with: Flank Pain   [name] is a 28 y.o. male.  With past medical history of asthma, prediabetes, hypertension, ADHD presents emergency room with complaint of right-sided flank pain.  He reports this has been intermittently present for the past year.  Of note he did have his gallbladder taken out about a week ago.  His incision site is clean dry intact.  No fever.  No right upper quadrant tenderness.  He reports that his pain feels similar to muscle spasm that he has had in the past.  He is run out of his pain medicine at home.  Now taking ibuprofen  without significant relief.  Denies nausea vomiting or diarrhea.  No numbness or tingling in groin.  No loss of bowel or bladder.    Flank Pain       Prior to Admission medications   Medication Sig Start Date End Date Taking? Authorizing Provider  albuterol  (VENTOLIN  HFA) 108 (90 Base) MCG/ACT inhaler Inhale 2 puffs into the lungs every 6 (six) hours as needed for wheezing or shortness of breath. 12/04/23   Rosendo Rush, MD  amLODipine  (NORVASC ) 5 MG tablet Take 1 tablet (5 mg total) by mouth at bedtime. 12/04/23   Rosendo Rush, MD  amphetamine -dextroamphetamine  (ADDERALL XR) 30 MG 24 hr capsule Take 1 capsule (30 mg total) by mouth every morning. Please make an appointment for further refills 02/23/24   Nicholas Bar, MD  amphetamine -dextroamphetamine  (ADDERALL) 10 MG tablet Take 1 tablet (10 mg total) by mouth daily as needed. Must have visit before next fill 02/23/24   Nicholas Bar, MD  cyclobenzaprine  (FLEXERIL ) 10 MG tablet Take 1 tablet (10 mg total) by mouth 2 (two) times daily as needed for muscle spasms. Patient taking differently: Take 10 mg by mouth in the morning and at bedtime. 01/07/24   Tegeler, Lonni PARAS, MD  diclofenac  Sodium (VOLTAREN  ARTHRITIS PAIN) 1 % GEL Apply 2 g  topically 4 (four) times daily. 02/08/23   Nicholas Bar, MD  oxyCODONE  (ROXICODONE ) 5 MG immediate release tablet Take 1 tablet (5 mg total) by mouth every 4 (four) hours as needed. 03/14/24   Ann Fine, MD  cloNIDine  HCl (KAPVAY ) 0.1 MG TB12 ER tablet Take 1 tablet (0.1 mg total) by mouth at bedtime. 01/07/16 03/14/18  Viviana Aleck ONEIDA, FNP  loratadine  (CLARITIN ) 10 MG tablet Take 1 tablet (10 mg total) by mouth daily. 11/10/14 01/27/16  Viviana Aleck ONEIDA, FNP  pantoprazole  (PROTONIX ) 40 MG tablet Take 1 tablet (40 mg total) by mouth daily. 10/05/15 01/27/16  Viviana Aleck ONEIDA, FNP    Allergies: Patient has no known allergies.    Review of Systems  Genitourinary:  Positive for flank pain.    Updated Vital Signs BP 134/88   Pulse 87   Temp 98.8 F (37.1 C)   Resp 17   Ht 5' 11 (1.803 m)   Wt (!) 163.3 kg   SpO2 99%   BMI 50.21 kg/m   Physical Exam Vitals and nursing note reviewed.  Constitutional:      General: He is not in acute distress.    Appearance: He is not toxic-appearing.  HENT:     Head: Normocephalic and atraumatic.  Eyes:     General: No scleral icterus.    Conjunctiva/sclera: Conjunctivae normal.  Cardiovascular:     Rate and Rhythm: Normal  rate and regular rhythm.     Pulses: Normal pulses.     Heart sounds: Normal heart sounds.  Pulmonary:     Effort: Pulmonary effort is normal. No respiratory distress.     Breath sounds: Normal breath sounds.  Abdominal:     General: Abdomen is flat. Bowel sounds are normal.     Palpations: Abdomen is soft.     Tenderness: There is no abdominal tenderness.     Comments: No right upper quadrant tenderness to palpation.  Incisions clean, dry, intact.  Musculoskeletal:     Right lower leg: No edema.     Left lower leg: No edema.     Comments: Right-sided flank tenderness.  Skin:    General: Skin is warm and dry.     Findings: No lesion.  Neurological:     General: No focal deficit present.     Mental Status: He  is alert and oriented to person, place, and time. Mental status is at baseline.     (all labs ordered are listed, but only abnormal results are displayed) Labs Reviewed - No data to display  EKG: None  Radiology: No results found.   Procedures   Medications Ordered in the ED - No data to display                                  Medical Decision Making Amount and/or Complexity of Data Reviewed Labs: ordered. Radiology: ordered.  Risk Prescription drug management.   This patient presents to the ED for concern of flank pain, this involves an extensive number of treatment options, and is a complaint that carries with it a high risk of complications and morbidity.  The differential diagnosis includes pyelonephritis, urinary tract infection, muscle strain, postop complication, choledocholithiasis, postop infection   Co morbidities that complicate the patient evaluation  Status post cholecystectomy   Additional history obtained:  Additional history obtained from op note 03/14/2024   Lab Tests:  I personally interpreted labs.  The pertinent results include:   CBC without leukocytosis.  No anemia.  Normal liver enzymes.  Normal total bili   Imaging Studies ordered:  I ordered imaging studies including CT abdomen pelvis I independently visualized and interpreted imaging which showed no acute findings.  Does have similar finding over gallbladder fossa from prior exam. I agree with the radiologist interpretation   Cardiac Monitoring: / EKG:  The patient was maintained on a cardiac monitor.     Problem List / ED Course / Critical interventions / Medication management  Patient reported to emergency room with complaint of right flank pain.  He he reports it is worse with movement and he has to reposition himself to get comfortable when lying down.  Denies any red flag symptoms associated with back pain.  Does have right flank pain, worse with palpation.  On my exam he  has soft nontender abdomen.  His incisions are clean dry intact.  Given that he is postop we will check abdominal labs to make sure there is no large transaminitis or elevation in bilirubin.  Also obtaining CT abdomen pelvis to rule out postop complication.  He is hemodynamically stable, afebrile. CT scan shows no acute findings.  Liver enzymes and total bilirubin are normal here.  Feel symptoms are more musculoskeletal in nature given patient's physical exam findings, CT findings and overall reassuring lab findings. I ordered medication including morphine , Zofran , normal saline for  pain Reevaluation of the patient after these medicines showed that the patient improved I have reviewed the patients home medicines and have made adjustments as needed Patient reports feeling better after pain medicine here.  Will send home with pain medicine.  Encouraged general surgery follow-up.  Patient will call to schedule appointment expresses under standing and agrees plan      Final diagnoses:  Flank pain    ED Discharge Orders     None          Shermon Warren SAILOR, PA-C 03/24/24 1310    Tegeler, Lonni PARAS, MD 03/24/24 1538

## 2024-03-24 NOTE — Discharge Instructions (Signed)
 Please follow-up with general surgery for recheck of symptoms.  Follow-up with primary care for back pain. Return to emergency room with new or worsening symptoms.

## 2024-03-24 NOTE — ED Triage Notes (Addendum)
 PT arrives via POV. PT reports he has been having right sided flank pain that has been coming and going for the past year. PT states he feels like he may be dehydrated. He reports his testicles are also sore. Pt is AxOx4. Denies n/v/d. No difficulty urinating. States he had his gallbladder removed on 07/24//25

## 2024-03-27 ENCOUNTER — Other Ambulatory Visit: Payer: Self-pay | Admitting: Family Medicine

## 2024-03-27 DIAGNOSIS — F909 Attention-deficit hyperactivity disorder, unspecified type: Secondary | ICD-10-CM

## 2024-03-28 ENCOUNTER — Encounter: Payer: Self-pay | Admitting: Family Medicine

## 2024-03-28 ENCOUNTER — Other Ambulatory Visit (HOSPITAL_COMMUNITY): Payer: Self-pay

## 2024-03-28 MED ORDER — AMPHETAMINE-DEXTROAMPHETAMINE 10 MG PO TABS
10.0000 mg | ORAL_TABLET | Freq: Every day | ORAL | 0 refills | Status: DC | PRN
  Filled 2024-03-28: qty 15, 15d supply, fill #0

## 2024-03-28 MED ORDER — AMPHETAMINE-DEXTROAMPHET ER 30 MG PO CP24
30.0000 mg | ORAL_CAPSULE | ORAL | 0 refills | Status: DC
  Filled 2024-03-28: qty 30, 30d supply, fill #0

## 2024-05-05 ENCOUNTER — Other Ambulatory Visit: Payer: Self-pay | Admitting: Family Medicine

## 2024-05-05 DIAGNOSIS — F909 Attention-deficit hyperactivity disorder, unspecified type: Secondary | ICD-10-CM

## 2024-05-06 MED ORDER — AMPHETAMINE-DEXTROAMPHET ER 30 MG PO CP24
30.0000 mg | ORAL_CAPSULE | ORAL | 0 refills | Status: DC
  Filled 2024-05-06: qty 30, 30d supply, fill #0

## 2024-05-06 MED ORDER — AMPHETAMINE-DEXTROAMPHETAMINE 10 MG PO TABS
10.0000 mg | ORAL_TABLET | Freq: Every day | ORAL | 0 refills | Status: DC | PRN
  Filled 2024-05-06: qty 15, 15d supply, fill #0

## 2024-05-07 ENCOUNTER — Other Ambulatory Visit (HOSPITAL_COMMUNITY): Payer: Self-pay

## 2024-05-08 ENCOUNTER — Other Ambulatory Visit (HOSPITAL_COMMUNITY): Payer: Self-pay

## 2024-06-07 ENCOUNTER — Other Ambulatory Visit: Payer: Self-pay | Admitting: Family Medicine

## 2024-06-07 DIAGNOSIS — F909 Attention-deficit hyperactivity disorder, unspecified type: Secondary | ICD-10-CM

## 2024-06-10 ENCOUNTER — Other Ambulatory Visit (HOSPITAL_COMMUNITY): Payer: Self-pay

## 2024-06-10 ENCOUNTER — Encounter: Payer: Self-pay | Admitting: Family Medicine

## 2024-06-10 DIAGNOSIS — F909 Attention-deficit hyperactivity disorder, unspecified type: Secondary | ICD-10-CM

## 2024-06-10 MED ORDER — AMPHETAMINE-DEXTROAMPHETAMINE 10 MG PO TABS: 10.0000 mg | ORAL_TABLET | Freq: Every day | ORAL | 0 refills | Status: DC | PRN

## 2024-06-10 MED ORDER — AMPHETAMINE-DEXTROAMPHET ER 30 MG PO CP24: 30.0000 mg | ORAL_CAPSULE | ORAL | 0 refills | Status: DC

## 2024-06-11 ENCOUNTER — Other Ambulatory Visit (HOSPITAL_COMMUNITY): Payer: Self-pay

## 2024-06-11 ENCOUNTER — Other Ambulatory Visit: Payer: Self-pay

## 2024-06-11 ENCOUNTER — Other Ambulatory Visit: Payer: Self-pay | Admitting: Family Medicine

## 2024-06-11 DIAGNOSIS — F909 Attention-deficit hyperactivity disorder, unspecified type: Secondary | ICD-10-CM

## 2024-06-11 MED ORDER — AMPHETAMINE-DEXTROAMPHETAMINE 10 MG PO TABS
10.0000 mg | ORAL_TABLET | Freq: Every day | ORAL | 0 refills | Status: DC | PRN
  Filled 2024-06-11: qty 15, 15d supply, fill #0

## 2024-06-11 MED ORDER — AMPHETAMINE-DEXTROAMPHET ER 30 MG PO CP24
30.0000 mg | ORAL_CAPSULE | ORAL | 0 refills | Status: DC
  Filled 2024-06-11: qty 30, 30d supply, fill #0

## 2024-06-11 NOTE — Telephone Encounter (Signed)
 Patient returns call to nurse line.   Advised that message has been sent to provider to resend.   Chiquita JAYSON English, RN

## 2024-06-11 NOTE — Telephone Encounter (Signed)
 Prescriptions were set to print.   Please resend.

## 2024-06-12 ENCOUNTER — Other Ambulatory Visit (HOSPITAL_COMMUNITY): Payer: Self-pay

## 2024-07-12 ENCOUNTER — Other Ambulatory Visit: Payer: Self-pay | Admitting: Family Medicine

## 2024-07-12 DIAGNOSIS — F909 Attention-deficit hyperactivity disorder, unspecified type: Secondary | ICD-10-CM

## 2024-07-12 MED ORDER — AMPHETAMINE-DEXTROAMPHETAMINE 10 MG PO TABS
10.0000 mg | ORAL_TABLET | Freq: Every day | ORAL | 0 refills | Status: DC | PRN
  Filled 2024-07-12: qty 15, 15d supply, fill #0

## 2024-07-12 MED ORDER — AMPHETAMINE-DEXTROAMPHET ER 30 MG PO CP24
30.0000 mg | ORAL_CAPSULE | ORAL | 0 refills | Status: DC
  Filled 2024-07-12: qty 30, 30d supply, fill #0

## 2024-07-15 ENCOUNTER — Other Ambulatory Visit (HOSPITAL_COMMUNITY): Payer: Self-pay

## 2024-08-06 ENCOUNTER — Telehealth: Payer: Self-pay | Admitting: Family Medicine

## 2024-08-06 NOTE — Telephone Encounter (Signed)
 Patient called asking to get a referral for pain management. Did make an appt 1/13 to see his new pcp.

## 2024-09-03 ENCOUNTER — Ambulatory Visit: Payer: Self-pay | Admitting: Family Medicine

## 2024-09-03 ENCOUNTER — Other Ambulatory Visit: Payer: Self-pay | Admitting: Family Medicine

## 2024-09-03 ENCOUNTER — Encounter: Payer: Self-pay | Admitting: Family Medicine

## 2024-09-03 VITALS — BP 133/87 | HR 74 | Ht 71.0 in | Wt 351.5 lb

## 2024-09-03 DIAGNOSIS — M25561 Pain in right knee: Secondary | ICD-10-CM | POA: Diagnosis not present

## 2024-09-03 DIAGNOSIS — M5432 Sciatica, left side: Secondary | ICD-10-CM | POA: Diagnosis not present

## 2024-09-03 DIAGNOSIS — M5431 Sciatica, right side: Secondary | ICD-10-CM | POA: Diagnosis present

## 2024-09-03 DIAGNOSIS — G8929 Other chronic pain: Secondary | ICD-10-CM

## 2024-09-03 DIAGNOSIS — F909 Attention-deficit hyperactivity disorder, unspecified type: Secondary | ICD-10-CM

## 2024-09-03 NOTE — Assessment & Plan Note (Signed)
 Chronic knee pain post-arthroscopy for lateral meniscus tear. Persistent pain despite prior therapy. Prefers pain management referral. Counseled patient that he could be evaluated further with orthopedics or sports medicine; however, patient declines.  - Referred to pain management clinic for further evaluation and management.

## 2024-09-03 NOTE — Patient Instructions (Signed)
 It was wonderful to see you today.  Please bring ALL of your medications with you to every visit.   Your back pain seems to be sciatica this can be from overuse or poor form when lifting. It is irritation of a nerve that sits between muscles in your back leading to your legs. I have referred you to physical therapy for this.   For your knee, you have chosen to go to pain medicine rather than continue orthopedic or sports medicine evaluation so I have sent in a referral.   Thank you for choosing [facility].   Please call [phone] with any questions about today's appointment.  Please be sure to schedule follow up at the front desk before you leave today.   Areta Saliva, MD  Family Medicine

## 2024-09-03 NOTE — Progress Notes (Signed)
" ° °  SUBJECTIVE:   CHIEF COMPLAINT / HPI:  Discussed the use of AI scribe software for clinical note transcription with the patient, who gave verbal consent to proceed.  History of Present Illness [name] is a 29 year old male who presents with chronic knee and back pain for pain management.  Knee pain - Chronic knee pain since meniscus surgery in 2017 - Pain worsens with cold weather and heavy physical activity - Home exercises attempted and attended one physical therapy course - Trying to lose weight to reduce stress on knees - Has been taking ibuprofen   - Followed up with sports medicine; however, does not want further workup and requests pain medicine clinic referral.   Back pain - Back pain present for about one year - Pain sometimes radiates to thighs and described as nerve pain - Uses ibuprofen  more frequently and oxycodone  right after his gallbladder surgery.  - Has tried heating pads and lidocaine  patches - Occasional leg weakness after heavy physical exertion - Has not tried physical therapy for back pain   Impact of physical activity and occupational factors - Physically demanding work history including [employer], moving company, and current landscaping job - Physical activity perceived to worsen back pain    PERTINENT  PMH / PSH: h/O lateral meniscus tear of the right tear s/p knee arthroscopic surgery in 2017  OBJECTIVE:  BP 133/87   Pulse 74   Ht 5' 11 (1.803 m)   Wt (!) 351 lb 8 oz (159.4 kg)   SpO2 97%   BMI 49.02 kg/m   General: well appearing, in no acute distress CV: RRR, radial pulses equal and palpable, no BLE edema  Resp: Normal work of breathing on room air, CTAB Neuro: Alert & Oriented x 4, PERRLA, EOMI, strength in upper and lower extremities 5/5 Back: lower back bilateral tenderness of paravertebral muscles, no midline tenderness  Straight leg test + bilaterally   ASSESSMENT/PLAN:   Assessment & Plan Bilateral sciatica Sciatica with pain  radiating from back to legs, worsened by activity. Most likely exacerbated by heavy lifting and physical nature of his previous occupations. No prior imaging. Physical therapy recommended for muscle and posture issues. - Referred to physical therapy for sciatica management. - Advised alternating ibuprofen  (600-800 mg) with acetaminophen  for pain. Recommend lidocaine  patches and heating pads. Discussed risks of overuse of ibuprofen .  - Discussed weight loss benefits to reduce knee stress. Chronic pain of right knee Chronic knee pain post-arthroscopy for lateral meniscus tear. Persistent pain despite prior therapy. Prefers pain management referral. Counseled patient that he could be evaluated further with orthopedics or sports medicine; however, patient declines.  - Referred to pain management clinic for further evaluation and management.      Areta Saliva, MD [facility] "

## 2024-09-05 ENCOUNTER — Other Ambulatory Visit (HOSPITAL_COMMUNITY): Payer: Self-pay

## 2024-09-05 MED ORDER — AMPHETAMINE-DEXTROAMPHET ER 30 MG PO CP24
30.0000 mg | ORAL_CAPSULE | ORAL | 0 refills | Status: AC
  Filled 2024-09-05: qty 30, 30d supply, fill #0

## 2024-09-05 MED ORDER — AMPHETAMINE-DEXTROAMPHETAMINE 10 MG PO TABS
10.0000 mg | ORAL_TABLET | Freq: Every day | ORAL | 0 refills | Status: AC | PRN
  Filled 2024-09-05: qty 15, 15d supply, fill #0

## 2024-09-25 ENCOUNTER — Ambulatory Visit
